# Patient Record
Sex: Male | Born: 1955 | Race: White | Hispanic: No | Marital: Married | Smoking: Never smoker
Health system: Southern US, Community
[De-identification: ages and names within clinical notes are randomized; demographics above are authoritative.]

## PROBLEM LIST (undated history)

## (undated) DIAGNOSIS — E7800 Pure hypercholesterolemia, unspecified: Secondary | ICD-10-CM

## (undated) DIAGNOSIS — Z8639 Personal history of other endocrine, nutritional and metabolic disease: Secondary | ICD-10-CM

## (undated) DIAGNOSIS — E119 Type 2 diabetes mellitus without complications: Secondary | ICD-10-CM

## (undated) DIAGNOSIS — C61 Malignant neoplasm of prostate: Secondary | ICD-10-CM

## (undated) DIAGNOSIS — K219 Gastro-esophageal reflux disease without esophagitis: Secondary | ICD-10-CM

## (undated) DIAGNOSIS — Z87442 Personal history of urinary calculi: Secondary | ICD-10-CM

## (undated) DIAGNOSIS — T8859XA Other complications of anesthesia, initial encounter: Secondary | ICD-10-CM

## (undated) DIAGNOSIS — T4145XA Adverse effect of unspecified anesthetic, initial encounter: Secondary | ICD-10-CM

## (undated) DIAGNOSIS — K5090 Crohn's disease, unspecified, without complications: Secondary | ICD-10-CM

## (undated) HISTORY — DX: Personal history of other endocrine, nutritional and metabolic disease: Z86.39

## (undated) HISTORY — DX: Pure hypercholesterolemia, unspecified: E78.00

## (undated) HISTORY — PX: PROSTATECTOMY: SHX69

## (undated) HISTORY — PX: LITHOTRIPSY: SUR834

## (undated) HISTORY — DX: Gastro-esophageal reflux disease without esophagitis: K21.9

## (undated) HISTORY — DX: Crohn's disease, unspecified, without complications: K50.90

## (undated) HISTORY — DX: Type 2 diabetes mellitus without complications: E11.9

---

## 1997-04-03 HISTORY — PX: TOOTH EXTRACTION: SUR596

## 2011-04-04 HISTORY — PX: COLONOSCOPY: SHX174

## 2017-01-10 DIAGNOSIS — J302 Other seasonal allergic rhinitis: Secondary | ICD-10-CM | POA: Diagnosis not present

## 2017-01-10 DIAGNOSIS — E782 Mixed hyperlipidemia: Secondary | ICD-10-CM | POA: Diagnosis not present

## 2017-01-10 DIAGNOSIS — Z23 Encounter for immunization: Secondary | ICD-10-CM | POA: Diagnosis not present

## 2017-01-10 DIAGNOSIS — N200 Calculus of kidney: Secondary | ICD-10-CM | POA: Diagnosis not present

## 2017-01-10 DIAGNOSIS — K219 Gastro-esophageal reflux disease without esophagitis: Secondary | ICD-10-CM | POA: Diagnosis not present

## 2017-04-24 DIAGNOSIS — R05 Cough: Secondary | ICD-10-CM | POA: Diagnosis not present

## 2017-04-24 DIAGNOSIS — J209 Acute bronchitis, unspecified: Secondary | ICD-10-CM | POA: Diagnosis not present

## 2017-04-24 DIAGNOSIS — K219 Gastro-esophageal reflux disease without esophagitis: Secondary | ICD-10-CM | POA: Diagnosis not present

## 2017-04-24 DIAGNOSIS — J302 Other seasonal allergic rhinitis: Secondary | ICD-10-CM | POA: Diagnosis not present

## 2017-07-05 ENCOUNTER — Encounter: Payer: Self-pay | Admitting: Internal Medicine

## 2017-07-05 DIAGNOSIS — N400 Enlarged prostate without lower urinary tract symptoms: Secondary | ICD-10-CM | POA: Diagnosis not present

## 2017-07-05 DIAGNOSIS — R7303 Prediabetes: Secondary | ICD-10-CM | POA: Diagnosis not present

## 2017-07-05 DIAGNOSIS — K219 Gastro-esophageal reflux disease without esophagitis: Secondary | ICD-10-CM | POA: Diagnosis not present

## 2017-07-05 DIAGNOSIS — E782 Mixed hyperlipidemia: Secondary | ICD-10-CM | POA: Diagnosis not present

## 2017-07-05 DIAGNOSIS — R5382 Chronic fatigue, unspecified: Secondary | ICD-10-CM | POA: Diagnosis not present

## 2017-07-10 DIAGNOSIS — E782 Mixed hyperlipidemia: Secondary | ICD-10-CM | POA: Diagnosis not present

## 2017-07-10 DIAGNOSIS — Z1331 Encounter for screening for depression: Secondary | ICD-10-CM | POA: Diagnosis not present

## 2017-07-10 DIAGNOSIS — N400 Enlarged prostate without lower urinary tract symptoms: Secondary | ICD-10-CM | POA: Diagnosis not present

## 2017-07-10 DIAGNOSIS — Z1389 Encounter for screening for other disorder: Secondary | ICD-10-CM | POA: Diagnosis not present

## 2017-07-10 DIAGNOSIS — R972 Elevated prostate specific antigen [PSA]: Secondary | ICD-10-CM | POA: Diagnosis not present

## 2017-08-06 ENCOUNTER — Encounter: Payer: Self-pay | Admitting: Internal Medicine

## 2017-08-06 DIAGNOSIS — N400 Enlarged prostate without lower urinary tract symptoms: Secondary | ICD-10-CM | POA: Diagnosis not present

## 2017-08-06 DIAGNOSIS — E300 Delayed puberty: Secondary | ICD-10-CM | POA: Diagnosis not present

## 2017-08-06 DIAGNOSIS — K529 Noninfective gastroenteritis and colitis, unspecified: Secondary | ICD-10-CM | POA: Diagnosis not present

## 2017-08-06 DIAGNOSIS — K5090 Crohn's disease, unspecified, without complications: Secondary | ICD-10-CM | POA: Diagnosis not present

## 2017-08-06 DIAGNOSIS — R300 Dysuria: Secondary | ICD-10-CM | POA: Diagnosis not present

## 2017-08-06 DIAGNOSIS — E860 Dehydration: Secondary | ICD-10-CM | POA: Diagnosis not present

## 2017-08-06 DIAGNOSIS — E119 Type 2 diabetes mellitus without complications: Secondary | ICD-10-CM | POA: Diagnosis not present

## 2017-08-06 DIAGNOSIS — K219 Gastro-esophageal reflux disease without esophagitis: Secondary | ICD-10-CM | POA: Diagnosis not present

## 2017-08-06 DIAGNOSIS — R112 Nausea with vomiting, unspecified: Secondary | ICD-10-CM | POA: Diagnosis not present

## 2017-08-06 DIAGNOSIS — R42 Dizziness and giddiness: Secondary | ICD-10-CM | POA: Diagnosis not present

## 2017-08-06 DIAGNOSIS — Z7982 Long term (current) use of aspirin: Secondary | ICD-10-CM | POA: Diagnosis not present

## 2017-08-06 DIAGNOSIS — E872 Acidosis: Secondary | ICD-10-CM | POA: Diagnosis not present

## 2017-08-06 DIAGNOSIS — R110 Nausea: Secondary | ICD-10-CM | POA: Diagnosis not present

## 2017-08-06 DIAGNOSIS — K760 Fatty (change of) liver, not elsewhere classified: Secondary | ICD-10-CM | POA: Diagnosis not present

## 2017-08-06 DIAGNOSIS — R740 Nonspecific elevation of levels of transaminase and lactic acid dehydrogenase [LDH]: Secondary | ICD-10-CM | POA: Diagnosis not present

## 2017-08-06 DIAGNOSIS — K5000 Crohn's disease of small intestine without complications: Secondary | ICD-10-CM | POA: Diagnosis not present

## 2017-08-07 DIAGNOSIS — K5090 Crohn's disease, unspecified, without complications: Secondary | ICD-10-CM | POA: Diagnosis not present

## 2017-08-07 DIAGNOSIS — E300 Delayed puberty: Secondary | ICD-10-CM | POA: Diagnosis not present

## 2017-08-07 DIAGNOSIS — R740 Nonspecific elevation of levels of transaminase and lactic acid dehydrogenase [LDH]: Secondary | ICD-10-CM | POA: Diagnosis not present

## 2017-08-07 DIAGNOSIS — E119 Type 2 diabetes mellitus without complications: Secondary | ICD-10-CM | POA: Diagnosis not present

## 2017-08-07 DIAGNOSIS — K5000 Crohn's disease of small intestine without complications: Secondary | ICD-10-CM | POA: Diagnosis not present

## 2017-08-08 DIAGNOSIS — K5090 Crohn's disease, unspecified, without complications: Secondary | ICD-10-CM | POA: Diagnosis not present

## 2017-08-16 ENCOUNTER — Encounter: Payer: Self-pay | Admitting: Internal Medicine

## 2017-08-16 DIAGNOSIS — A09 Infectious gastroenteritis and colitis, unspecified: Secondary | ICD-10-CM | POA: Diagnosis not present

## 2017-08-16 DIAGNOSIS — K760 Fatty (change of) liver, not elsewhere classified: Secondary | ICD-10-CM | POA: Diagnosis not present

## 2017-08-16 DIAGNOSIS — E119 Type 2 diabetes mellitus without complications: Secondary | ICD-10-CM | POA: Diagnosis not present

## 2017-08-28 ENCOUNTER — Encounter (INDEPENDENT_AMBULATORY_CARE_PROVIDER_SITE_OTHER): Payer: Self-pay | Admitting: Internal Medicine

## 2017-08-28 ENCOUNTER — Ambulatory Visit (INDEPENDENT_AMBULATORY_CARE_PROVIDER_SITE_OTHER): Payer: BLUE CROSS/BLUE SHIELD | Admitting: Internal Medicine

## 2017-08-28 DIAGNOSIS — K50018 Crohn's disease of small intestine with other complication: Secondary | ICD-10-CM

## 2017-08-28 DIAGNOSIS — K5090 Crohn's disease, unspecified, without complications: Secondary | ICD-10-CM

## 2017-08-28 HISTORY — DX: Crohn's disease, unspecified, without complications: K50.90

## 2017-08-28 NOTE — Patient Instructions (Signed)
Lab

## 2017-08-28 NOTE — Progress Notes (Addendum)
   Subjective:    Patient ID: Brett Trevino, male    DOB: 05-07-1955, 62 y.o.   MRN: [id number]  HPI Referred by Dr. Pleas Koch for Crohn's disease. Admitted to [facility] in May with abdominal pain, weakness, nausea x 10 days. Also had vomited which was black. Vomitied x 2 days. He really did not have any diarrhea. There was no fever.  CT abdomen revealed wall thickening with hazy adjacent inflammation involving the distal ileum with the small bowel proximal to this dilated to a maximum of 4.3 cm. The transition point is the proximal extent of the wall thickening of the distal ileum, which is just distal to the terminal ileum. The terminal ileum walll shows fatty replacement, likely result of chronic inflammation. Findings are consistent with Crohn's disease but could reflect an infectious enteritis.  There are short segment areas of apparent wall thickening the ileum, finding that supports Crohn's disease. He was covered with Cipro and steroids while in hospital  Is last colonoscopy was in July of 2013 screening: And was normal.   No family hx of Crohns disease. He says he is 100% better. Appetite is much better. There is occasionally some mild abdominal pain but nothing serious per patient. ' He is having a BM x 2-3 times a day.  No NSAIDs.   5/16/20190 Hand H 14.7 and 44.1  Diabetic x 10  Yrs.    Hx of diabetes. Works as a Office manager Past Medical History:  Diagnosis Date  . Crohn disease ([place]) 08/28/2017  . Diabetes ([place])   . High cholesterol       No Known Allergies  Current Outpatient Medications on File Prior to Visit  Medication Sig Dispense Refill  . aspirin EC 81 MG tablet Take 81 mg by mouth daily.    Marland Kitchen atorvastatin (LIPITOR) 10 MG tablet Take 10 mg by mouth daily.    . cyanocobalamin 1000 MCG tablet Take 1,000 mcg by mouth daily.    . metFORMIN (GLUCOPHAGE) 500 MG tablet Take by mouth 2 (two) times daily with a meal.    . Multiple Vitamins-Minerals  (MULTIVITAMIN ADULTS PO) Take by mouth.     No current facility-administered medications on file prior to visit.         Objective:   Physical Exam Blood pressure 130/82, pulse 64, temperature 98.2 F (36.8 C), height 5\' 7"  (1.702 m), weight 242 lb 1.6 oz (109.8 kg). Alert and oriented. Skin warm and dry. Oral mucosa is moist.   . Sclera anicteric, conjunctivae is pink. Thyroid not enlarged. No cervical lymphadenopathy. Lungs clear. Heart regular rate and rhythm.  Abdomen is soft. Bowel sounds are positive. No hepatomegaly. No abdominal masses felt. No tenderness.  No edema to lower extremities.           Assessment & Plan:  Colitis. Am going to get a IBD panel and sedrate and discuss with DR. Rehman. May need a colonoscopy.

## 2017-08-30 ENCOUNTER — Telehealth (INDEPENDENT_AMBULATORY_CARE_PROVIDER_SITE_OTHER): Payer: Self-pay | Admitting: Internal Medicine

## 2017-08-30 NOTE — Telephone Encounter (Signed)
Results given to patient

## 2017-08-30 NOTE — Telephone Encounter (Signed)
Please call patient at ph# [phone]

## 2017-08-31 ENCOUNTER — Ambulatory Visit (INDEPENDENT_AMBULATORY_CARE_PROVIDER_SITE_OTHER): Payer: BLUE CROSS/BLUE SHIELD | Admitting: Urology

## 2017-08-31 DIAGNOSIS — N401 Enlarged prostate with lower urinary tract symptoms: Secondary | ICD-10-CM | POA: Diagnosis not present

## 2017-08-31 DIAGNOSIS — N5201 Erectile dysfunction due to arterial insufficiency: Secondary | ICD-10-CM

## 2017-08-31 DIAGNOSIS — R972 Elevated prostate specific antigen [PSA]: Secondary | ICD-10-CM

## 2017-08-31 DIAGNOSIS — R351 Nocturia: Secondary | ICD-10-CM | POA: Diagnosis not present

## 2017-09-03 LAB — INFLAM. BOWEL DISEASE DIFF. PANEL
ANCA SCREEN: NEGATIVE
Myeloperoxidase Abs: <1 AI (ref <1.0)
SACCHAROMYCES CEREVISIAE AB (ASCA)(IGA): 11.2 U (ref <=20.0)
SACCHAROMYCES CEREVISIAE AB (ASCA)(IGG): 36.6 U — AB (ref <=20.0)
Serine Protease 3: <1 AI (ref <1.0)

## 2017-09-03 LAB — SEDIMENTATION RATE: SED RATE: 9 mm/h (ref 0–20)

## 2017-09-04 ENCOUNTER — Telehealth (INDEPENDENT_AMBULATORY_CARE_PROVIDER_SITE_OTHER): Payer: Self-pay | Admitting: Internal Medicine

## 2017-09-04 DIAGNOSIS — R935 Abnormal findings on diagnostic imaging of other abdominal regions, including retroperitoneum: Secondary | ICD-10-CM

## 2017-09-04 DIAGNOSIS — R1030 Lower abdominal pain, unspecified: Secondary | ICD-10-CM

## 2017-09-04 NOTE — Telephone Encounter (Signed)
Will order a CT abdomen/pelvis with CM

## 2017-09-04 NOTE — Telephone Encounter (Signed)
Brett Trevino, CT abdomen/pelvis w/wo

## 2017-09-05 ENCOUNTER — Telehealth (INDEPENDENT_AMBULATORY_CARE_PROVIDER_SITE_OTHER): Payer: Self-pay | Admitting: Internal Medicine

## 2017-09-05 DIAGNOSIS — R1031 Right lower quadrant pain: Secondary | ICD-10-CM

## 2017-09-05 DIAGNOSIS — R935 Abnormal findings on diagnostic imaging of other abdominal regions, including retroperitoneum: Secondary | ICD-10-CM

## 2017-09-05 NOTE — Telephone Encounter (Signed)
CT sch'd 09/20/17 at 4 pm (345), water only after 10 pm, pick up contrast, left detailed message for patient

## 2017-09-05 NOTE — Telephone Encounter (Signed)
CT ordered. 

## 2017-09-05 NOTE — Addendum Note (Signed)
Addended by: Butch Penny on: 09/05/2017 11:46 AM   Modules accepted: Orders

## 2017-09-07 DIAGNOSIS — I700 Atherosclerosis of aorta: Secondary | ICD-10-CM | POA: Diagnosis not present

## 2017-09-07 DIAGNOSIS — E119 Type 2 diabetes mellitus without complications: Secondary | ICD-10-CM | POA: Diagnosis not present

## 2017-09-07 DIAGNOSIS — K760 Fatty (change of) liver, not elsewhere classified: Secondary | ICD-10-CM | POA: Diagnosis not present

## 2017-09-07 DIAGNOSIS — A09 Infectious gastroenteritis and colitis, unspecified: Secondary | ICD-10-CM | POA: Diagnosis not present

## 2017-09-14 ENCOUNTER — Encounter (HOSPITAL_COMMUNITY): Payer: Self-pay

## 2017-09-14 ENCOUNTER — Other Ambulatory Visit: Payer: Self-pay | Admitting: Urology

## 2017-09-14 ENCOUNTER — Ambulatory Visit (HOSPITAL_COMMUNITY)
Admission: RE | Admit: 2017-09-14 | Discharge: 2017-09-14 | Disposition: A | Discharge status: Home / Self Care | Payer: BLUE CROSS/BLUE SHIELD | Source: Ambulatory Visit | Attending: Urology | Admitting: Urology

## 2017-09-14 DIAGNOSIS — R972 Elevated prostate specific antigen [PSA]: Secondary | ICD-10-CM | POA: Insufficient documentation

## 2017-09-14 MED ORDER — LIDOCAINE HCL (PF) 2 % IJ SOLN
INTRAMUSCULAR | Status: AC
  Filled 2017-09-14: qty 10

## 2017-09-14 MED ORDER — CEFTRIAXONE SODIUM 1 G IJ SOLR: 1.0000 g | Freq: Once | INTRAMUSCULAR | Status: DC

## 2017-09-14 MED ORDER — LIDOCAINE HCL (PF) 2 % IJ SOLN
10.0000 mL | Freq: Once | INTRAMUSCULAR | Status: DC
Start: 2017-09-14 — End: 2017-09-15

## 2017-09-14 MED ORDER — LIDOCAINE HCL (PF) 1 % IJ SOLN
INTRAMUSCULAR | Status: AC
  Filled 2017-09-14: qty 5

## 2017-09-14 MED ORDER — CEFTRIAXONE SODIUM 1 G IJ SOLR
INTRAMUSCULAR | Status: AC
  Filled 2017-09-14: qty 10

## 2017-09-18 ENCOUNTER — Other Ambulatory Visit: Payer: Self-pay | Admitting: Urology

## 2017-09-20 ENCOUNTER — Ambulatory Visit (HOSPITAL_COMMUNITY)
Admission: RE | Admit: 2017-09-20 | Discharge: 2017-09-20 | Disposition: A | Discharge status: Home / Self Care | Payer: BLUE CROSS/BLUE SHIELD | Source: Ambulatory Visit | Attending: Internal Medicine | Admitting: Internal Medicine

## 2017-09-20 DIAGNOSIS — R1031 Right lower quadrant pain: Secondary | ICD-10-CM | POA: Diagnosis not present

## 2017-09-20 DIAGNOSIS — K760 Fatty (change of) liver, not elsewhere classified: Secondary | ICD-10-CM | POA: Insufficient documentation

## 2017-09-20 DIAGNOSIS — N200 Calculus of kidney: Secondary | ICD-10-CM | POA: Insufficient documentation

## 2017-09-20 DIAGNOSIS — R109 Unspecified abdominal pain: Secondary | ICD-10-CM | POA: Diagnosis not present

## 2017-09-20 DIAGNOSIS — I700 Atherosclerosis of aorta: Secondary | ICD-10-CM | POA: Insufficient documentation

## 2017-09-20 DIAGNOSIS — R1110 Vomiting, unspecified: Secondary | ICD-10-CM | POA: Diagnosis not present

## 2017-09-20 LAB — POCT I-STAT CREATININE: CREATININE: 0.9 mg/dL (ref 0.61–1.24)

## 2017-09-20 MED ORDER — IOPAMIDOL (ISOVUE-300) INJECTION 61%
100.0000 mL | Freq: Once | INTRAVENOUS | Status: AC | PRN
  Administered 2017-09-20: 100 mL via INTRAVENOUS

## 2017-09-21 NOTE — Patient Instructions (Signed)
Brett Trevino  09/21/2017     @PREFPERIOPPHARMACY @   Your procedure is scheduled on  09/28/2017   Report to [facility] at  1000   A.M.  Call this number if you have problems the morning of surgery:  [phone]   Remember:  Do not eat or drink after midnight.  You may drink clear liquids until 12 midnight on 09/27/2017 .  Clear liquids allowed are:                    Water, Juice (non-citric and without pulp), Carbonated beverages, Clear Tea, Black Coffee only, Plain Jell-O only, Gatorade and Plain Popsicles only    Take these medicines the morning of surgery with A SIP OF WATER  None    Do not wear jewelry, make-up or nail polish.  Do not wear lotions, powders, or perfumes, or deodorant.  Do not shave 48 hours prior to surgery.  Men may shave face and neck.  Do not bring valuables to the hospital.  [facility] is not responsible for any belongings or valuables.  Contacts, dentures or bridgework may not be worn into surgery.  Leave your suitcase in the car.  After surgery it may be brought to your room.  For patients admitted to the hospital, discharge time will be determined by your treatment team.  Patients discharged the day of surgery will not be allowed to drive home.   Name and phone number of your driver:   family Special instructions:    Please read over the following fact sheets that you were given. Anesthesia Post-op Instructions and Care and Recovery After Surgery       Transrectal Ultrasound-Guided Biopsy A transrectal ultrasound-guided biopsy is a procedure to take samples of tissue from your prostate. Ultrasound images are used to guide the procedure. It is usually done to check the prostate gland for cancer. What happens before the procedure?  Do not eat or drink after midnight on the night before your procedure.  Take medicines as your doctor tells you.  Your doctor may have you stop taking some medicines 5-7 days before the  procedure.  You will be given an enema before your procedure. During an enema, a liquid is put into your butt (rectum) to clear out waste.  You may have lab tests the day of your procedure.  Make plans to have someone drive you home. What happens during the procedure?  You will be given medicine to help you relax before the procedure. An IV tube will be put into one of your veins. It will be used to give fluids and medicine.  You will be given medicine to reduce the risk of infection (antibiotic).  You will be placed on your side.  A probe with gel will be put in your butt. This is used to take pictures of your prostate and the area around it.  A medicine to numb the area is put into your prostate.  A biopsy needle is then inserted and guided to your prostate.  Samples of prostate tissue are taken. The needle is removed.  The samples are sent to a lab to be checked. Results are usually back in 2-3 days. What happens after the procedure?  You will be taken to a room where you will be watched until you are doing okay.  You may have some pain in the area around your butt. You will be given medicines for this.  You  may be able to go home the same day. Sometimes, an overnight stay in the hospital is needed. This information is not intended to replace advice given to you by your health care provider. Make sure you discuss any questions you have with your health care provider. Document Released: 03/08/2009 Document Revised: 08/26/2015 Document Reviewed: 11/06/2012 Elsevier Interactive Patient Education  2018 [employer].  Transrectal Ultrasound Transrectal ultrasound is a procedure that uses sound waves to create images of your prostate gland and nearby tissues. The sound waves are sent through the wall of your rectum into your prostate gland, which is located in front of your rectum. The images show the size and shape of your prostate gland and nearby structures. You may have this  test if you have:  Trouble urinating.  Infertility.  An abnormal prostate screening exam.  Tell a health care provider about:  Any allergies you have.  All medicines you are taking, including vitamins, herbs, eye drops, creams, and over-the-counter medicines.  Any blood disorders you have.  Any medical conditions you have.  Any surgeries you have had. What are the risks? Generally, this is a safe procedure. However, problems may occur, including:  Discomfort during the procedure. This is rare.  Blood in your urine or sperm after the procedure. This is rare.  What happens before the procedure?  Your health care provider may instruct you to use an enema 1-4 hours before the procedure. Follow instructions from your health care provider about how to do the enema.  Ask your health care provider about changing or stopping your regular medicines. This is especially important if you are taking diabetes medicines or blood thinners. What happens during the procedure?  You will be asked to lie down on your left side on an examination table.  You will bend your knees toward your chest.  A lubricated probe will be gently inserted into your rectum. This may cause a feeling of fullness.  The probe will send signals to a computer that will create images.  The technician will slightly rotate the probe throughout the procedure. While rotating the probe, he or she will view and capture images of the prostate gland and the surrounding structures from different angles.  The probe will be removed. The procedure may vary among health care providers and hospitals. What happens after the procedure?  It is up to you to get the results of your procedure. Ask your health care provider, or the department that is doing the procedure, when your results will be ready. Summary  Transrectal ultrasound is a procedure that uses sound waves to create images of your prostate gland and nearby  tissues.  The images show the size and shape of your prostate gland and nearby structures.  Before the procedure, ask your health care provider about changing or stopping your regular medicines. This is especially important if you are taking diabetes medicines or blood thinners. This information is not intended to replace advice given to you by your health care provider. Make sure you discuss any questions you have with your health care provider. Document Released: 12/28/2004 Document Revised: 02/11/2016 Document Reviewed: 02/11/2016 Elsevier Interactive Patient Education  2017 [place] Anesthesia is a term that refers to techniques, procedures, and medicines that help a person stay safe and comfortable during a medical procedure. Monitored anesthesia care, or sedation, is one type of anesthesia. Your anesthesia specialist may recommend sedation if you will be having a procedure that does not require  you to be unconscious, such as:  Cataract surgery.  A dental procedure.  A biopsy.  A colonoscopy.  During the procedure, you may receive a medicine to help you relax (sedative). There are three levels of sedation:  Mild sedation. At this level, you may feel awake and relaxed. You will be able to follow directions.  Moderate sedation. At this level, you will be sleepy. You may not remember the procedure.  Deep sedation. At this level, you will be asleep. You will not remember the procedure.  The more medicine you are given, the deeper your level of sedation will be. Depending on how you respond to the procedure, the anesthesia specialist may change your level of sedation or the type of anesthesia to fit your needs. An anesthesia specialist will monitor you closely during the procedure. Let your health care provider know about:  Any allergies you have.  All medicines you are taking, including vitamins, herbs, eye drops, creams, and over-the-counter  medicines.  Any use of steroids (by mouth or as a cream).  Any problems you or family members have had with sedatives and anesthetic medicines.  Any blood disorders you have.  Any surgeries you have had.  Any medical conditions you have, such as sleep apnea.  Whether you are pregnant or may be pregnant.  Any use of cigarettes, alcohol, or street drugs. What are the risks? Generally, this is a safe procedure. However, problems may occur, including:  Getting too much medicine (oversedation).  Nausea.  Allergic reaction to medicines.  Trouble breathing. If this happens, a breathing tube may be used to help with breathing. It will be removed when you are awake and breathing on your own.  Heart trouble.  Lung trouble.  Before the procedure Staying hydrated Follow instructions from your health care provider about hydration, which may include:  Up to 2 hours before the procedure - you may continue to drink clear liquids, such as water, clear fruit juice, black coffee, and plain tea.  Eating and drinking restrictions Follow instructions from your health care provider about eating and drinking, which may include:  8 hours before the procedure - stop eating heavy meals or foods such as meat, fried foods, or fatty foods.  6 hours before the procedure - stop eating light meals or foods, such as toast or cereal.  6 hours before the procedure - stop drinking milk or drinks that contain milk.  2 hours before the procedure - stop drinking clear liquids.  Medicines Ask your health care provider about:  Changing or stopping your regular medicines. This is especially important if you are taking diabetes medicines or blood thinners.  Taking medicines such as aspirin and ibuprofen. These medicines can thin your blood. Do not take these medicines before your procedure if your health care provider instructs you not to.  Tests and exams  You will have a physical exam.  You may have  blood tests done to show: ? How well your kidneys and liver are working. ? How well your blood can clot.  General instructions  Plan to have someone take you home from the hospital or clinic.  If you will be going home right after the procedure, plan to have someone with you for 24 hours.  What happens during the procedure?  Your blood pressure, heart rate, breathing, level of pain and overall condition will be monitored.  An IV tube will be inserted into one of your veins.  Your anesthesia specialist will give you medicines  as needed to keep you comfortable during the procedure. This may mean changing the level of sedation.  The procedure will be performed. After the procedure  Your blood pressure, heart rate, breathing rate, and blood oxygen level will be monitored until the medicines you were given have worn off.  Do not drive for 24 hours if you received a sedative.  You may: ? Feel sleepy, clumsy, or nauseous. ? Feel forgetful about what happened after the procedure. ? Have a sore throat if you had a breathing tube during the procedure. ? Vomit. This information is not intended to replace advice given to you by your health care provider. Make sure you discuss any questions you have with your health care provider. Document Released: 12/14/2004 Document Revised: 08/27/2015 Document Reviewed: 07/11/2015 Elsevier Interactive Patient Education  2018 [place], Care After These instructions provide you with information about caring for yourself after your procedure. Your health care provider may also give you more specific instructions. Your treatment has been planned according to current medical practices, but problems sometimes occur. Call your health care provider if you have any problems or questions after your procedure. What can I expect after the procedure? After your procedure, it is common to:  Feel sleepy for several hours.  Feel clumsy  and have poor balance for several hours.  Feel forgetful about what happened after the procedure.  Have poor judgment for several hours.  Feel nauseous or vomit.  Have a sore throat if you had a breathing tube during the procedure.  Follow these instructions at home: For at least 24 hours after the procedure:   Do not: ? Participate in activities in which you could fall or become injured. ? Drive. ? Use heavy machinery. ? Drink alcohol. ? Take sleeping pills or medicines that cause drowsiness. ? Make important decisions or sign legal documents. ? Take care of children on your own.  Rest. Eating and drinking  Follow the diet that is recommended by your health care provider.  If you vomit, drink water, juice, or soup when you can drink without vomiting.  Make sure you have little or no nausea before eating solid foods. General instructions  Have a responsible adult stay with you until you are awake and alert.  Take over-the-counter and prescription medicines only as told by your health care provider.  If you smoke, do not smoke without supervision.  Keep all follow-up visits as told by your health care provider. This is important. Contact a health care provider if:  You keep feeling nauseous or you keep vomiting.  You feel light-headed.  You develop a rash.  You have a fever. Get help right away if:  You have trouble breathing. This information is not intended to replace advice given to you by your health care provider. Make sure you discuss any questions you have with your health care provider. Document Released: 07/11/2015 Document Revised: 11/10/2015 Document Reviewed: 07/11/2015 Elsevier Interactive Patient Education  [employer].

## 2017-09-25 ENCOUNTER — Other Ambulatory Visit: Payer: Self-pay

## 2017-09-25 ENCOUNTER — Encounter (HOSPITAL_COMMUNITY): Payer: Self-pay

## 2017-09-25 ENCOUNTER — Encounter (HOSPITAL_COMMUNITY)
Admission: RE | Admit: 2017-09-25 | Discharge: 2017-09-25 | Disposition: A | Discharge status: Home / Self Care | Payer: BLUE CROSS/BLUE SHIELD | Source: Ambulatory Visit | Attending: Urology | Admitting: Urology

## 2017-09-25 DIAGNOSIS — Z01810 Encounter for preprocedural cardiovascular examination: Secondary | ICD-10-CM | POA: Insufficient documentation

## 2017-09-25 DIAGNOSIS — Z01812 Encounter for preprocedural laboratory examination: Secondary | ICD-10-CM | POA: Insufficient documentation

## 2017-09-25 HISTORY — DX: Other complications of anesthesia, initial encounter: T88.59XA

## 2017-09-25 HISTORY — DX: Adverse effect of unspecified anesthetic, initial encounter: T41.45XA

## 2017-09-25 LAB — CBC WITH DIFFERENTIAL/PLATELET
BASOS ABS: 0 10*3/uL (ref 0.0–0.1)
Basophils Relative: 1 %
Eosinophils Absolute: 0.2 10*3/uL (ref 0.0–0.7)
Eosinophils Relative: 3 %
HEMATOCRIT: 42.6 % (ref 39.0–52.0)
HEMOGLOBIN: 14.6 g/dL (ref 13.0–17.0)
Lymphocytes Relative: 31 %
Lymphs Abs: 2 10*3/uL (ref 0.7–4.0)
MCH: 31.7 pg (ref 26.0–34.0)
MCHC: 34.3 g/dL (ref 30.0–36.0)
MCV: 92.6 fL (ref 78.0–100.0)
Monocytes Absolute: 0.6 10*3/uL (ref 0.1–1.0)
Monocytes Relative: 9 %
NEUTROS ABS: 3.8 10*3/uL (ref 1.7–7.7)
Neutrophils Relative %: 56 %
Platelets: 234 10*3/uL (ref 150–400)
RBC: 4.6 MIL/uL (ref 4.22–5.81)
RDW: 14.1 % (ref 11.5–15.5)
WBC: 6.6 10*3/uL (ref 4.0–10.5)

## 2017-09-25 LAB — BASIC METABOLIC PANEL
ANION GAP: 8 (ref 5–15)
BUN: 9 mg/dL (ref 8–23)
CHLORIDE: 110 mmol/L (ref 98–111)
CO2: 25 mmol/L (ref 22–32)
Calcium: 8.9 mg/dL (ref 8.9–10.3)
Creatinine, Ser: 0.89 mg/dL (ref 0.61–1.24)
GFR calc Af Amer: >60 mL/min (ref >60)
GLUCOSE: 121 mg/dL — AB (ref 70–99)
POTASSIUM: 3.6 mmol/L (ref 3.5–5.1)
Sodium: 143 mmol/L (ref 135–145)

## 2017-09-25 LAB — GLUCOSE, CAPILLARY: Glucose-Capillary: 134 mg/dL — ABNORMAL HIGH (ref 70–99)

## 2017-09-25 NOTE — Progress Notes (Signed)
   09/25/17 1442  OBSTRUCTIVE SLEEP APNEA  Have you ever been diagnosed with sleep apnea through a sleep study? No  Do you snore loudly (loud enough to be heard through closed doors)?  1  Do you often feel tired, fatigued, or sleepy during the daytime (such as falling asleep during driving or talking to someone)? 0  Has anyone observed you stop breathing during your sleep? 0  Do you have, or are you being treated for high blood pressure? 0  BMI more than 35 kg/m2? 1  Age > 50 (1-yes) 1  Neck circumference greater than:Male 16 inches or larger, Male 17inches or larger? 0  Male Gender (Yes=1) 1  Obstructive Sleep Apnea Score 4

## 2017-09-26 LAB — HEMOGLOBIN A1C
Hgb A1c MFr Bld: 6.3 % — ABNORMAL HIGH (ref 4.8–5.6)
Mean Plasma Glucose: 134.11 mg/dL

## 2017-09-28 ENCOUNTER — Encounter (HOSPITAL_COMMUNITY): Payer: Self-pay | Admitting: *Deleted

## 2017-09-28 ENCOUNTER — Ambulatory Visit (HOSPITAL_COMMUNITY): Payer: BLUE CROSS/BLUE SHIELD | Admitting: Anesthesiology

## 2017-09-28 ENCOUNTER — Other Ambulatory Visit: Payer: Self-pay

## 2017-09-28 ENCOUNTER — Ambulatory Visit (HOSPITAL_COMMUNITY)
Admission: RE | Admit: 2017-09-28 | Discharge: 2017-09-28 | Disposition: A | Discharge status: Home / Self Care | Payer: BLUE CROSS/BLUE SHIELD | Source: Ambulatory Visit | Attending: Urology | Admitting: Urology

## 2017-09-28 ENCOUNTER — Encounter (HOSPITAL_COMMUNITY)
Admission: RE | Disposition: A | Discharge status: Home / Self Care | Payer: Self-pay | Source: Ambulatory Visit | Attending: Urology

## 2017-09-28 ENCOUNTER — Ambulatory Visit (HOSPITAL_COMMUNITY): Payer: BLUE CROSS/BLUE SHIELD

## 2017-09-28 DIAGNOSIS — K624 Stenosis of anus and rectum: Secondary | ICD-10-CM | POA: Diagnosis not present

## 2017-09-28 DIAGNOSIS — N5201 Erectile dysfunction due to arterial insufficiency: Secondary | ICD-10-CM | POA: Insufficient documentation

## 2017-09-28 DIAGNOSIS — R351 Nocturia: Secondary | ICD-10-CM | POA: Diagnosis not present

## 2017-09-28 DIAGNOSIS — Z79899 Other long term (current) drug therapy: Secondary | ICD-10-CM | POA: Insufficient documentation

## 2017-09-28 DIAGNOSIS — D075 Carcinoma in situ of prostate: Secondary | ICD-10-CM | POA: Insufficient documentation

## 2017-09-28 DIAGNOSIS — Z7984 Long term (current) use of oral hypoglycemic drugs: Secondary | ICD-10-CM | POA: Insufficient documentation

## 2017-09-28 DIAGNOSIS — K219 Gastro-esophageal reflux disease without esophagitis: Secondary | ICD-10-CM | POA: Diagnosis not present

## 2017-09-28 DIAGNOSIS — N401 Enlarged prostate with lower urinary tract symptoms: Secondary | ICD-10-CM | POA: Insufficient documentation

## 2017-09-28 DIAGNOSIS — R972 Elevated prostate specific antigen [PSA]: Secondary | ICD-10-CM

## 2017-09-28 DIAGNOSIS — Z7982 Long term (current) use of aspirin: Secondary | ICD-10-CM | POA: Insufficient documentation

## 2017-09-28 DIAGNOSIS — E119 Type 2 diabetes mellitus without complications: Secondary | ICD-10-CM | POA: Diagnosis not present

## 2017-09-28 DIAGNOSIS — C61 Malignant neoplasm of prostate: Secondary | ICD-10-CM | POA: Insufficient documentation

## 2017-09-28 DIAGNOSIS — E7800 Pure hypercholesterolemia, unspecified: Secondary | ICD-10-CM | POA: Insufficient documentation

## 2017-09-28 HISTORY — PX: PROSTATE BIOPSY: SHX241

## 2017-09-28 LAB — GLUCOSE, CAPILLARY
GLUCOSE-CAPILLARY: 115 mg/dL — AB (ref 70–99)
Glucose-Capillary: 125 mg/dL — ABNORMAL HIGH (ref 70–99)

## 2017-09-28 SURGERY — BIOPSY, PROSTATE, RECTAL APPROACH, WITH US GUIDANCE
Anesthesia: Monitor Anesthesia Care | Site: Rectum

## 2017-09-28 MED ORDER — LIDOCAINE HCL (PF) 2 % IJ SOLN
INTRAMUSCULAR | Status: AC
Start: 2017-09-28 — End: ?
  Filled 2017-09-28: qty 10

## 2017-09-28 MED ORDER — SODIUM CHLORIDE 0.9% FLUSH: 3.0000 mL | Freq: Two times a day (BID) | INTRAVENOUS | Status: DC

## 2017-09-28 MED ORDER — SODIUM CHLORIDE 0.9 % IV SOLN
2.0000 g | INTRAVENOUS | Status: AC
  Administered 2017-09-28: 2 g via INTRAVENOUS
  Filled 2017-09-28 (×2): qty 20

## 2017-09-28 MED ORDER — GLYCOPYRROLATE 0.2 MG/ML IJ SOLN
INTRAMUSCULAR | Status: AC
  Filled 2017-09-28: qty 1

## 2017-09-28 MED ORDER — PROPOFOL 10 MG/ML IV BOLUS
INTRAVENOUS | Status: AC
Start: 2017-09-28 — End: ?
  Filled 2017-09-28: qty 20

## 2017-09-28 MED ORDER — FENTANYL CITRATE (PF) 100 MCG/2ML IJ SOLN
INTRAMUSCULAR | Status: DC | PRN
  Administered 2017-09-28 (×2): 50 ug via INTRAVENOUS

## 2017-09-28 MED ORDER — ONDANSETRON HCL 4 MG/2ML IJ SOLN
INTRAMUSCULAR | Status: DC | PRN
  Administered 2017-09-28: 4 mg via INTRAVENOUS

## 2017-09-28 MED ORDER — OXYCODONE HCL 5 MG PO TABS: 5.0000 mg | ORAL_TABLET | ORAL | Status: DC | PRN

## 2017-09-28 MED ORDER — ACETAMINOPHEN 325 MG PO TABS: 650.0000 mg | ORAL_TABLET | ORAL | Status: DC | PRN

## 2017-09-28 MED ORDER — MIDAZOLAM HCL 5 MG/5ML IJ SOLN
INTRAMUSCULAR | Status: DC | PRN
  Administered 2017-09-28: 2 mg via INTRAVENOUS

## 2017-09-28 MED ORDER — ACETAMINOPHEN 650 MG RE SUPP
650.0000 mg | RECTAL | Status: DC | PRN
  Filled 2017-09-28: qty 1

## 2017-09-28 MED ORDER — HYDROMORPHONE HCL 1 MG/ML IJ SOLN: 0.2500 mg | INTRAMUSCULAR | Status: DC | PRN

## 2017-09-28 MED ORDER — MIDAZOLAM HCL 2 MG/2ML IJ SOLN
INTRAMUSCULAR | Status: AC
  Filled 2017-09-28: qty 2

## 2017-09-28 MED ORDER — SODIUM CHLORIDE 0.9 % IV SOLN: 250.0000 mL | INTRAVENOUS | Status: DC | PRN

## 2017-09-28 MED ORDER — MORPHINE SULFATE (PF) 2 MG/ML IV SOLN: 1.0000 mg | INTRAVENOUS | Status: DC | PRN

## 2017-09-28 MED ORDER — HYDROCODONE-ACETAMINOPHEN 7.5-325 MG PO TABS: 1.0000 | ORAL_TABLET | Freq: Once | ORAL | Status: DC | PRN

## 2017-09-28 MED ORDER — FENTANYL CITRATE (PF) 100 MCG/2ML IJ SOLN
INTRAMUSCULAR | Status: AC
  Filled 2017-09-28: qty 2

## 2017-09-28 MED ORDER — LIDOCAINE HCL (PF) 1 % IJ SOLN
INTRAMUSCULAR | Status: AC
  Filled 2017-09-28: qty 5

## 2017-09-28 MED ORDER — PROPOFOL 10 MG/ML IV BOLUS
INTRAVENOUS | Status: AC
  Filled 2017-09-28: qty 20

## 2017-09-28 MED ORDER — ONDANSETRON HCL 4 MG/2ML IJ SOLN: 4.0000 mg | Freq: Once | INTRAMUSCULAR | Status: DC | PRN

## 2017-09-28 MED ORDER — ONDANSETRON HCL 4 MG/2ML IJ SOLN
INTRAMUSCULAR | Status: AC
Start: 2017-09-28 — End: ?
  Filled 2017-09-28: qty 2

## 2017-09-28 MED ORDER — PROPOFOL 10 MG/ML IV BOLUS
INTRAVENOUS | Status: DC | PRN
  Administered 2017-09-28 (×3): 20 mg via INTRAVENOUS
  Administered 2017-09-28: 30 mg via INTRAVENOUS
  Administered 2017-09-28 (×2): 20 mg via INTRAVENOUS

## 2017-09-28 MED ORDER — KETOROLAC TROMETHAMINE 30 MG/ML IJ SOLN: 30.0000 mg | Freq: Once | INTRAMUSCULAR | Status: DC | PRN

## 2017-09-28 MED ORDER — LACTATED RINGERS IV SOLN
INTRAVENOUS | Status: DC
  Administered 2017-09-28: 11:00:00 via INTRAVENOUS

## 2017-09-28 MED ORDER — GENTAMICIN SULFATE 40 MG/ML IJ SOLN
5.0000 mg/kg | INTRAVENOUS | Status: AC
  Administered 2017-09-28: 420 mg via INTRAVENOUS
  Filled 2017-09-28: qty 10.5

## 2017-09-28 MED ORDER — SODIUM CHLORIDE 0.9% FLUSH: 3.0000 mL | INTRAVENOUS | Status: DC | PRN

## 2017-09-28 MED ORDER — LIDOCAINE 2% (20 MG/ML) 5 ML SYRINGE
INTRAMUSCULAR | Status: DC | PRN
  Administered 2017-09-28: 60 mg via INTRAVENOUS

## 2017-09-28 MED ORDER — MEPERIDINE HCL 50 MG/ML IJ SOLN: 6.2500 mg | INTRAMUSCULAR | Status: DC | PRN

## 2017-09-28 MED ORDER — GLYCOPYRROLATE 0.2 MG/ML IJ SOLN
0.2000 mg | Freq: Once | INTRAMUSCULAR | Status: AC
  Administered 2017-09-28: 0.2 mg via INTRAVENOUS

## 2017-09-28 SURGICAL SUPPLY — 13 items
CLOTH BEACON ORANGE TIMEOUT ST (SAFETY) ×2 IMPLANT
COVER MAYO STAND XLG (DRAPE) ×2 IMPLANT
FORMALIN 10 PREFIL 20ML (MISCELLANEOUS) ×20 IMPLANT
GLOVE SURG SS PI 8.0 STRL IVOR (GLOVE) IMPLANT
KIT TURNOVER CYSTO (KITS) ×2 IMPLANT
LUBRICANT JELLY 4.5OZ STERILE (MISCELLANEOUS) ×2 IMPLANT
NDL SAFETY ECLIPSE 18X1.5 (NEEDLE) ×1 IMPLANT
NEEDLE BIOPSY 18X20 MAGNUM (NEEDLE) ×2 IMPLANT
NEEDLE HYPO 18GX1.5 SHARP (NEEDLE) ×1
NEEDLE SPNL 22GX8 TOUHY BVL BK (NEEDLE) ×2 IMPLANT
PAD ARMBOARD 7.5X6 YLW CONV (MISCELLANEOUS) ×2 IMPLANT
SYR 10ML LL (SYRINGE) ×2 IMPLANT
TOWEL OR 17X26 4PK STRL BLUE (TOWEL DISPOSABLE) ×2 IMPLANT

## 2017-09-28 NOTE — Transfer of Care (Signed)
Immediate Anesthesia Transfer of Care Note  Patient: Markelle Najarian  Procedure(s) Performed: BIOPSY TRANSRECTAL ULTRASONIC PROSTATE (TUBP) (N/A Rectum)  Patient Location: PACU  Anesthesia Type:MAC  Level of Consciousness: sedated  Airway & Oxygen Therapy: Patient Spontanous Breathing  Post-op Assessment: Report given to RN and Post -op Vital signs reviewed and stable  Post vital signs: Reviewed and stable  Last Vitals:  Vitals Value Taken Time  BP    Temp    Pulse 64 09/28/2017 12:35 PM  Resp 11 09/28/2017 12:35 PM  SpO2 93 % 09/28/2017 12:35 PM  Vitals shown include unvalidated device data.  Last Pain:  Vitals:   09/28/17 1024  TempSrc: Oral  PainSc: 0-No pain         Complications: No apparent anesthesia complications

## 2017-09-28 NOTE — Op Note (Signed)
Procedure: 1.  Transrectal prostate ultrasound. 2.  Ultrasound-guided prostate biopsy.  Preop diagnosis: Elevated PSA.  Postop diagnosis: Same.  Surgeon: Dr. Irine Seal.  Anesthesia: MAC.  Specimen: 12 core prostate biopsy.  Drain: None.  EBL: None.  Complications: None.  Indications: Brett Trevino is a 62 year old white male who is to undergo a transrectal ultrasound of the prostate with biopsy for PSA of 17.  A prior attempted biopsy was unsuccessful due to patient's anal stenosis so it was rescheduled under anesthesia.  Procedure: He was taken to the operating room where he was given Rocephin and gentamicin.  He was placed in the left lateral decubitus position and sedation was given as needed.  The transrectal ultrasound probe was assembled and inserted with some resistance from the known anal stenosis and the prostate was examined.  The prostate ultrasound demonstrated a 21 mL prostate with a normal peripheral zone and no hypoechoic lesion in the peripheral zone.  There was a hypoechoic area in the left apical transitional zone but this could be just a BPH nodule.  There were a few scattered calculi.  The seminal vesicles were normal.  After completion of the diagnostic scan a 12 core template biopsy was performed in the usual configuration.  There was minimal bleeding.  After completion of the biopsy the probe was removed and the patient was rolled supine.  He was then moved to the recovery room in stable condition.  There were no complications.

## 2017-09-28 NOTE — Anesthesia Preprocedure Evaluation (Signed)
Anesthesia Evaluation  Patient identified by MRN, date of birth, ID band Patient awake    Reviewed: Allergy & Precautions, H&P , NPO status , Patient's Chart, lab work & pertinent test results, reviewed documented beta blocker date and time   Airway Mallampati: II  TM Distance: >3 FB Neck ROM: full    Dental no notable dental hx.    Pulmonary neg pulmonary ROS,    Pulmonary exam normal breath sounds clear to auscultation       Cardiovascular Exercise Tolerance: Good negative cardio ROS   Rhythm:regular Rate:Normal     Neuro/Psych negative neurological ROS  negative psych ROS   GI/Hepatic negative GI ROS, Neg liver ROS,   Endo/Other  negative endocrine ROSdiabetes, Type 2  Renal/GU negative Renal ROS  negative genitourinary   Musculoskeletal   Abdominal   Peds  Hematology negative hematology ROS (+)   Anesthesia Other Findings   Reproductive/Obstetrics negative OB ROS                             Anesthesia Physical Anesthesia Plan  ASA: II  Anesthesia Plan: MAC   Post-op Pain Management:    Induction:   PONV Risk Score and Plan:   Airway Management Planned:   Additional Equipment:   Intra-op Plan:   Post-operative Plan:   Informed Consent: I have reviewed the patients History and Physical, chart, labs and discussed the procedure including the risks, benefits and alternatives for the proposed anesthesia with the patient or authorized representative who has indicated his/her understanding and acceptance.   Dental Advisory Given  Plan Discussed with: CRNA  Anesthesia Plan Comments:         Anesthesia Quick Evaluation

## 2017-09-28 NOTE — Progress Notes (Signed)
Patient "feeling much better". Skin dry to touch, patient sitting in recliner talking with wife.

## 2017-09-28 NOTE — H&P (Signed)
CC: My PSA is elevated above the normal range.  HPI: Brett Trevino is a 62 year-old male patient who was referred by Dr. Judd Lien, MD who is here for an elevated PSA.  His PSA is 13. He has had PSA's drawn prior to this one. He has not had elevated PSA's prior to this one. He indicates there were no urinary problems when the PSA was drawn. He has not had a prostate nodule on a physical examination. He has not had recurrent prostate infections or chronic prostatitis.   He has not been on antibiotics for prostate infections previously. He has not had a prostate biopsy done.   Brett Trevino is a 62 yo WM who is sent in consultation by Dr. Pleas Koch for an elevated PSA of 13 from 07/05/17. He has only lived in the area for 9 months and he has had PSA's in they had been low at about 1 1-70yrs ago. He has severe LUTS that has been present for some time but it has been progressive and his IPSS is 30. He has nocturia x 4. He has not had treatment for LUTs. He has a stone 3 years ago. He has had no UTI's or prostatitis. He has had no GU surgery. 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   He was hospitalized for N/V and abdominal pain about 2 weeks ago. CT suggested possible enteritis consistent with Crohn's. He is seeing Dr. Laural Golden.     AUA Symptom Score: Almost always he has the sensation of not emptying his bladder completely when finished urinating. More than 50% of the time he has to urinate again fewer than two hours after he has finished urinating. Almost always he has to start and stop again several times when he urinates. More than 50% of the time he finds it difficult to postpone urination. More than 50% of the time he has a weak urinary stream. More than 50% of the time he has to push or strain to begin urination. He has to get up to urinate 4 times from the time he goes to bed until the time he gets up in the morning.   Calculated AUA Symptom Score:  30    QOL Score: He would feel mostly dissatisfied if he had to live with his urinary condition the way it is now for the rest of his life.   Calculated QOL Symptom Score: 4    ALLERGIES: None   MEDICATIONS: Aspirin  Metformin Hcl  Atorvastatin Calcium  Magnesium  Multivitamin  Vitamin B12     GU PSH: None   NON-GU PSH: None   GU PMH: None   NON-GU PMH: Diabetes Type 2 GERD Hypercholesterolemia    FAMILY HISTORY: Colon Cancer - Runs in Family Diabetes - Runs in Family heart - Runs in Family   SOCIAL HISTORY: Marital Status: Married Preferred Language: English Current Smoking Status: Patient has never smoked.   Tobacco Use Assessment Completed: Used Tobacco in last 30 days? Has never drank.  Does not drink caffeine. Patient's occupation Engineer, maintenance.    REVIEW OF SYSTEMS:    GU Review Male:   Patient reports frequent urination, hard to postpone urination, get up at night to urinate, stream starts and stops, trouble starting your stream, have to strain to urinate , and erection problems. Patient denies burning/ pain with urination, leakage of urine, and penile pain.  Gastrointestinal (Upper):   Patient reports nausea, vomiting, and indigestion/  heartburn.   Gastrointestinal (Lower):   Patient denies diarrhea and constipation.  Constitutional:   Patient reports fatigue. Patient denies fever, night sweats, and weight loss.  Skin:   Patient reports itching. Patient denies skin rash/ lesion.  Eyes:   Patient denies blurred vision and double vision.  Ears/ Nose/ Throat:   Patient denies sore throat and sinus problems.  Hematologic/Lymphatic:   Patient denies swollen glands and easy bruising.  Cardiovascular:   Patient denies leg swelling and chest pains.  Respiratory:   Patient denies cough and shortness of breath.  Endocrine:   Patient reports excessive thirst.   Musculoskeletal:   Patient reports back pain. Patient denies joint pain.  Neurological:   Patient  reports dizziness. Patient denies headaches.  Psychologic:   Patient denies depression and anxiety.   VITAL SIGNS:      08/31/2017 09:13 AM  Weight 242 lb / 109.77 kg  Height 67 in / 170.18 cm  BP 130/75 mmHg  Pulse 83 /min  Temperature 98.5 F / 36.9 C  BMI 37.9 kg/m   GU PHYSICAL EXAMINATION:    Anus and Perineum: Moderate anal stenosis. No hemorrhoids. No rectal fissure, no anal fissure. No edema, no dimple, no perineal tenderness, no anal tenderness.   Scrotum: No lesions. No edema. No cysts. No warts.  Epididymides: Right: right head contains a spermatocele. Right: no masses, no cysts, no tenderness, no induration, no enlargement. Left: No spermatocele, no masses, no cysts, no tenderness, no induration, no enlargement.   Testes: No tenderness, no swelling, no enlargement left testes. No tenderness, no swelling, no enlargement right testes. Normal location left testes. Normal location right testes. No mass, no cyst, no varicocele, no hydrocele left testes. No mass, no cyst, no varicocele, no hydrocele right testes.  Urethral Meatus: Normal size. No lesion, no wart, no discharge, no polyp. Normal location.  Penis: Circumcised, no warts, no cracks. No dorsal Peyronie's plaques, no left corporal Peyronie's plaques, no right corporal Peyronie's plaques, no scarring, no warts. No balanitis, no meatal stenosis.  Prostate: Prostate 2 + size. Left lobe firm, right lobe firm. Symmetrical lobes. No prostate nodule. Left lobe no tenderness, right lobe no tenderness.   Seminal Vesicles: Nonpalpable.  Sphincter Tone: Normal sphincter. No rectal tenderness. No rectal mass.    MULTI-SYSTEM PHYSICAL EXAMINATION:    Constitutional: Obese. No physical deformities. Normally developed. Good grooming.   Neck: Neck symmetrical, not swollen. Normal tracheal position.  Respiratory: No labored breathing, no use of accessory muscles. Normal breath sounds.  Cardiovascular: Regular rate and rhythm. No murmur, no  gallop.   Lymphatic: No enlargement, no tenderness of axillae, supraclavicular, groin, neck lymph nodes.  Skin: No paleness, no jaundice, no cyanosis. No lesion, no ulcer, no rash.  Neurologic / Psychiatric: Oriented to time, oriented to place, oriented to person. No depression, no anxiety, no agitation.  Gastrointestinal: Abdominal tenderness, mild, obese. No hernia. No mass, no rigidity.   Eyes: Normal conjunctivae. Normal eyelids.  Ears, Nose, Mouth, and Throat: Left ear no scars, no lesions, no masses. Right ear no scars, no lesions, no masses. Nose no scars, no lesions, no masses. Normal hearing. Normal lips.  Musculoskeletal: Normal gait and station of head and neck.     PAST DATA REVIEWED:  Source Of History:  Patient  Lab Test Review:   PSA  Records Review:   AUA Symptom Score, Previous Doctor Records  Urine Test Review:   Urinalysis  X-Ray Review: C.T. Abdomen/Pelvis: Reviewed Films. Reviewed Report. Discussed With  Patient. Possible Crohn's. prostate was not too big but has some hyperechoic areas primarily on the left.     07/05/17  PSA  Total PSA 13 ng/dl   Notes:                     I have reviewed records from Dr. Pleas Koch and also from Dr. Olevia Perches office. They are just getting started on the Crohn's eval. Patient's sed rate was 9.    PROCEDURES:           PVR Ultrasound - 41937  Scanned Volume: 16.3 cc         Urinalysis - 81003 Dipstick Dipstick Cont'd  Specimen: Voided Bilirubin: Neg  Color: Yellow Ketones: Neg  Appearance: Clear Blood: Neg  Specific Gravity: 1.025 Protein: Trace  pH: 6.0 Urobilinogen: 0.2  Glucose: Neg Nitrites: Neg    Leukocyte Esterase: Trace    ASSESSMENT:      ICD-10 Details  1 GU:   Elevated PSA - R97.20 His PSA jumped from 1 to 13 over the last year or so. I have found these rapid increases can often be erroneous or related to non-malignant causes. I will repeat the PSA today and if it remains elevated, I will arrange a biopsy. He has  some anal stenosis and will need valium for the biopsy. I reviewed the risks of bleeding, infection and voiding difficulty should the biopsy be required.   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 I discussed consideration of medical therapy but doesn't want to pursue that at this time.   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 We can address that in the future if he desires.    PLAN:           Orders Labs PSA with Reflex          Schedule Return Visit/Planned Activity: 3 Months - Office Visit             Note: I will call with the results of the PSA and if it is falling I will have him return in 3 months with another check. If it remains elevated, he will need a biopsy.           Document Letter(s):  Created for Patient: Clinical Summary

## 2017-09-28 NOTE — Discharge Instructions (Addendum)
Transrectal Ultrasound-Guided Biopsy A transrectal ultrasound-guided biopsy is a procedure to remove samples of tissue from your prostate using ultrasound images to guide the procedure. The procedure is usually done to evaluate the prostate gland of men who have an elevated prostate-specific antigen (PSA). PSA is a blood test to screen for prostate cancer. The biopsy samples are taken to check for prostate cancer. Tell a health care provider about:  Any allergies you have.  All medicines you are taking, including vitamins, herbs, eye drops, creams, and over-the-counter medicines.  Any problems you or family members have had with anesthetic medicines.  Any blood disorders you have.  Any surgeries you have had.  Any medical conditions you have. What are the risks? Generally, this is a safe procedure. However, as with any procedure, problems can occur. Possible problems include:  Infection of your prostate.  Bleeding from your rectum or blood in your urine.  Difficulty urinating.  Nerve damage (this is usually temporary).  Damage to surrounding structures such as blood vessels, organs, and muscles, which would require other procedures.  What happens before the procedure?  Do not eat or drink anything after midnight on the night before the procedure or as directed by your health care provider.  Take medicines only as directed by your health care provider.  Your health care provider may have you stop taking certain medicines 5-7 days before the procedure.  You will be given an enema before the procedure. During an enema, a liquid is injected into your rectum to clear out waste.  You may have lab tests the day of your procedure.  Plan to have someone take you home after the procedure. What happens during the procedure?  You will be given medicine to help you relax (sedative) before the procedure. An IV tube will be inserted into one of your veins and used to give fluids and  medicine.  You will be given antibiotic medicine to reduce the risk of an infection.  You will be placed on your side for the procedure.  A probe with lubricated gel will be placed into your rectum, and images will be taken of your prostate and surrounding structures.  Numbing medicine will be injected into the prostate before the biopsy samples are taken.  A biopsy needle will then be inserted and guided to your prostate with the use of the ultrasound images.  Samples of prostate tissue will be taken, and the needle will then be removed.  The biopsy samples will be sent to a lab to be analyzed. Results are usually back in 2-3 days. What happens after the procedure?  You will be taken to a recovery area where you will be monitored.  You may have some discomfort in the rectal area. You will be given pain medicines to control this.  You may be allowed to go home the same day, or you may need to stay in the hospital overnight.  Please call if you have fever >101, heavy bleeding or difficulty urinating.   I will call with the biopsy results.   This information is not intended to replace advice given to you by your health care provider. Make sure you discuss any questions you have with your health care provider. Document Released: 08/04/2013 Document Revised: 08/26/2015 Document Reviewed: 11/06/2012 Elsevier Interactive Patient Education  2018 [place] Anesthesia, Adult, Care After These instructions provide you with information about caring for yourself after your procedure. Your health care provider may also give you more  specific instructions. Your treatment has been planned according to current medical practices, but problems sometimes occur. Call your health care provider if you have any problems or questions after your procedure. What can I expect after the procedure? After the procedure, it is common to have:  Vomiting.  A sore throat.  Mental slowness.  It  is common to feel:  Nauseous.  Cold or shivery.  Sleepy.  Tired.  Sore or achy, even in parts of your body where you did not have surgery.  Follow these instructions at home: For at least 24 hours after the procedure:  Do not: ? Participate in activities where you could fall or become injured. ? Drive. ? Use heavy machinery. ? Drink alcohol. ? Take sleeping pills or medicines that cause drowsiness. ? Make important decisions or sign legal documents. ? Take care of children on your own.  Rest. Eating and drinking  If you vomit, drink water, juice, or soup when you can drink without vomiting.  Drink enough fluid to keep your urine clear or pale yellow.  Make sure you have little or no nausea before eating solid foods.  Follow the diet recommended by your health care provider. General instructions  Have a responsible adult stay with you until you are awake and alert.  Return to your normal activities as told by your health care provider. Ask your health care provider what activities are safe for you.  Take over-the-counter and prescription medicines only as told by your health care provider.  If you smoke, do not smoke without supervision.  Keep all follow-up visits as told by your health care provider. This is important. Contact a health care provider if:  You continue to have nausea or vomiting at home, and medicines are not helpful.  You cannot drink fluids or start eating again.  You cannot urinate after 8-12 hours.  You develop a skin rash.  You have fever.  You have increasing redness at the site of your procedure. Get help right away if:  You have difficulty breathing.  You have chest pain.  You have unexpected bleeding.  You feel that you are having a life-threatening or urgent problem. This information is not intended to replace advice given to you by your health care provider. Make sure you discuss any questions you have with your health care  provider. Document Released: 06/26/2000 Document Revised: 08/23/2015 Document Reviewed: 03/04/2015 Elsevier Interactive Patient Education  [employer].

## 2017-09-28 NOTE — Anesthesia Postprocedure Evaluation (Signed)
Anesthesia Post Note  Patient: Brett Trevino  Procedure(s) Performed: BIOPSY TRANSRECTAL ULTRASONIC PROSTATE (TUBP) (N/A Rectum)  Patient location during evaluation: PACU Anesthesia Type: MAC Level of consciousness: awake and alert Pain management: pain level controlled Vital Signs Assessment: post-procedure vital signs reviewed and stable Respiratory status: spontaneous breathing Cardiovascular status: blood pressure returned to baseline Postop Assessment: no apparent nausea or vomiting Anesthetic complications: no     Last Vitals:  Vitals:   09/28/17 1024 09/28/17 1235  BP: 134/82 96/63  Pulse: 85 64  Resp: 15 11  Temp: 36.7 C 36.6 C  SpO2: 95% 93%    Last Pain:  Vitals:   09/28/17 1235  TempSrc:   PainSc: Asleep                 [name]

## 2017-09-28 NOTE — Progress Notes (Signed)
Patient "feel strange", skin clammy. Patient alert and oriented. Placed in reclining chair. Dr. Rick Duff in to see patient.

## 2017-09-28 NOTE — Progress Notes (Signed)
Patient states "I feel much better".

## 2017-09-28 NOTE — Anesthesia Procedure Notes (Signed)
Date/Time: 09/28/2017 12:15 PM Performed by: Talbot Grumbling, CRNA Oxygen Delivery Method: Non-rebreather mask

## 2017-10-01 ENCOUNTER — Encounter: Payer: Self-pay | Admitting: Family Medicine

## 2017-10-01 ENCOUNTER — Encounter (HOSPITAL_COMMUNITY): Payer: Self-pay | Admitting: Urology

## 2017-10-01 DIAGNOSIS — E119 Type 2 diabetes mellitus without complications: Secondary | ICD-10-CM | POA: Diagnosis not present

## 2017-10-02 DIAGNOSIS — H6122 Impacted cerumen, left ear: Secondary | ICD-10-CM | POA: Diagnosis not present

## 2017-10-05 ENCOUNTER — Ambulatory Visit: Payer: BLUE CROSS/BLUE SHIELD | Admitting: Urology

## 2017-10-09 DIAGNOSIS — I700 Atherosclerosis of aorta: Secondary | ICD-10-CM | POA: Diagnosis not present

## 2017-10-09 DIAGNOSIS — E782 Mixed hyperlipidemia: Secondary | ICD-10-CM | POA: Diagnosis not present

## 2017-10-09 DIAGNOSIS — E119 Type 2 diabetes mellitus without complications: Secondary | ICD-10-CM | POA: Diagnosis not present

## 2017-10-09 DIAGNOSIS — K760 Fatty (change of) liver, not elsewhere classified: Secondary | ICD-10-CM | POA: Diagnosis not present

## 2017-10-12 ENCOUNTER — Other Ambulatory Visit: Payer: Self-pay | Admitting: Urology

## 2017-10-12 DIAGNOSIS — C61 Malignant neoplasm of prostate: Secondary | ICD-10-CM

## 2017-10-16 ENCOUNTER — Encounter (HOSPITAL_COMMUNITY): Payer: Self-pay

## 2017-10-16 ENCOUNTER — Encounter (HOSPITAL_COMMUNITY)
Admission: RE | Admit: 2017-10-16 | Discharge: 2017-10-16 | Disposition: A | Discharge status: Home / Self Care | Payer: BLUE CROSS/BLUE SHIELD | Source: Ambulatory Visit | Attending: Urology | Admitting: Urology

## 2017-10-16 DIAGNOSIS — C61 Malignant neoplasm of prostate: Secondary | ICD-10-CM

## 2017-10-16 MED ORDER — TECHNETIUM TC 99M MEDRONATE IV KIT
20.0000 | PACK | Freq: Once | INTRAVENOUS | Status: AC | PRN
  Administered 2017-10-16: 21 via INTRAVENOUS

## 2017-10-26 ENCOUNTER — Institutional Professional Consult (permissible substitution) (INDEPENDENT_AMBULATORY_CARE_PROVIDER_SITE_OTHER): Payer: BLUE CROSS/BLUE SHIELD | Admitting: Urology

## 2017-10-26 DIAGNOSIS — N401 Enlarged prostate with lower urinary tract symptoms: Secondary | ICD-10-CM

## 2017-10-26 DIAGNOSIS — R972 Elevated prostate specific antigen [PSA]: Secondary | ICD-10-CM

## 2017-10-26 DIAGNOSIS — C61 Malignant neoplasm of prostate: Secondary | ICD-10-CM | POA: Diagnosis not present

## 2017-10-30 DIAGNOSIS — C61 Malignant neoplasm of prostate: Secondary | ICD-10-CM | POA: Diagnosis not present

## 2017-11-09 DIAGNOSIS — M6281 Muscle weakness (generalized): Secondary | ICD-10-CM | POA: Diagnosis not present

## 2017-11-09 DIAGNOSIS — C61 Malignant neoplasm of prostate: Secondary | ICD-10-CM | POA: Diagnosis not present

## 2017-11-13 ENCOUNTER — Other Ambulatory Visit: Payer: Self-pay | Admitting: Urology

## 2017-11-23 NOTE — Patient Instructions (Addendum)
Rykar Lebleu  11/23/2017   Your procedure is scheduled on: 12-06-17   Report to [facility] Main  Entrance    Report to admitting at 9:15 AM    Call this number if you have problems the morning of surgery [phone]   Remember: Do not eat food or drink liquids :After Midnight.     Take these medicines the morning of surgery with A SIP OF WATER: None                                 You may not have any metal on your body including hair pins and              piercings  Do not wear jewelry, lotions, powders, cologne or deodorant             Men may shave face and neck.   Do not bring valuables to the hospital. [place].  Contacts, dentures or bridgework may not be worn into surgery.  Leave suitcase in the car. After surgery it may be brought to your room.      Special Instructions: N/A              Please read over the following fact sheets you were given: _____________________________________________________________________          How to Manage Your Diabetes Before and After Surgery  Why is it important to control my blood sugar before and after surgery? . Improving blood sugar levels before and after surgery helps healing and can limit problems. . A way of improving blood sugar control is eating a healthy diet by: o  Eating less sugar and carbohydrates o  Increasing activity/exercise o  Talking with your doctor about reaching your blood sugar goals . High blood sugars (greater than 180 mg/dL) can raise your risk of infections and slow your recovery, so you will need to focus on controlling your diabetes during the weeks before surgery. . Make sure that the doctor who takes care of your diabetes knows about your planned surgery including the date and location.  How do I manage my blood sugar before surgery? . Check your blood sugar at least 4 times a day, starting 2 days before surgery, to make  sure that the level is not too high or low. o Check your blood sugar the morning of your surgery when you wake up and every 2 hours until you get to the Short Stay unit. . If your blood sugar is less than 70 mg/dL, you will need to treat for low blood sugar: o Do not take insulin. o Treat a low blood sugar (less than 70 mg/dL) with  cup of clear juice (cranberry or apple), 4 glucose tablets, OR glucose gel. o Recheck blood sugar in 15 minutes after treatment (to make sure it is greater than 70 mg/dL). If your blood sugar is not greater than 70 mg/dL on recheck, call [phone] for further instructions. . Report your blood sugar to the short stay nurse when you get to Short Stay.  . If you are admitted to the hospital after surgery: o Your blood sugar will be checked by the staff and you will probably be given insulin after surgery (instead of  oral diabetes medicines) to make sure you have good blood sugar levels. o The goal for blood sugar control after surgery is 80-180 mg/dL.   WHAT DO I DO ABOUT MY DIABETES MEDICATION?  Marland Kitchen Do not take oral diabetes medicines (pills) the morning of surgery.  . THE DAY BEFORE SURGERY, take your usual dose of Metformin      [facility] - Preparing for Surgery Before surgery, you can play an important role.  Because skin is not sterile, your skin needs to be as free of germs as possible.  You can reduce the number of germs on your skin by washing with CHG (chlorahexidine gluconate) soap before surgery.  CHG is an antiseptic cleaner which kills germs and bonds with the skin to continue killing germs even after washing. Please DO NOT use if you have an allergy to CHG or antibacterial soaps.  If your skin becomes reddened/irritated stop using the CHG and inform your nurse when you arrive at Short Stay. Do not shave (including legs and underarms) for at least 48 hours prior to the first CHG shower.  You may shave your face/neck. Please follow these instructions  carefully:  1.  Shower with CHG Soap the night before surgery and the  morning of Surgery.  2.  If you choose to wash your hair, wash your hair first as usual with your  normal  shampoo.  3.  After you shampoo, rinse your hair and body thoroughly to remove the  shampoo.                           4.  Use CHG as you would any other liquid soap.  You can apply chg directly  to the skin and wash                       Gently with a scrungie or clean washcloth.  5.  Apply the CHG Soap to your body ONLY FROM THE NECK DOWN.   Do not use on face/ open                           Wound or open sores. Avoid contact with eyes, ears mouth and genitals (private parts).                       Wash face,  Genitals (private parts) with your normal soap.             6.  Wash thoroughly, paying special attention to the area where your surgery  will be performed.  7.  Thoroughly rinse your body with warm water from the neck down.  8.  DO NOT shower/wash with your normal soap after using and rinsing off  the CHG Soap.                9.  Pat yourself dry with a clean towel.            10.  Wear clean pajamas.            11.  Place clean sheets on your bed the night of your first shower and do not  sleep with pets. Day of Surgery : Do not apply any lotions/deodorants the morning of surgery.  Please wear clean clothes to the hospital/surgery center.  FAILURE TO FOLLOW THESE INSTRUCTIONS MAY RESULT IN THE CANCELLATION OF YOUR SURGERY PATIENT  SIGNATURE_________________________________  NURSE SIGNATURE__________________________________  ________________________________________________________________________

## 2017-11-26 ENCOUNTER — Encounter (HOSPITAL_COMMUNITY)
Admission: RE | Admit: 2017-11-26 | Discharge: 2017-11-26 | Disposition: A | Discharge status: Home / Self Care | Payer: BLUE CROSS/BLUE SHIELD | Source: Ambulatory Visit | Attending: Urology | Admitting: Urology

## 2017-11-26 ENCOUNTER — Encounter (HOSPITAL_COMMUNITY): Payer: Self-pay

## 2017-11-26 ENCOUNTER — Other Ambulatory Visit: Payer: Self-pay

## 2017-11-26 DIAGNOSIS — C61 Malignant neoplasm of prostate: Secondary | ICD-10-CM | POA: Insufficient documentation

## 2017-11-26 DIAGNOSIS — M6281 Muscle weakness (generalized): Secondary | ICD-10-CM | POA: Diagnosis not present

## 2017-11-26 DIAGNOSIS — Z0183 Encounter for blood typing: Secondary | ICD-10-CM | POA: Insufficient documentation

## 2017-11-26 DIAGNOSIS — Z01812 Encounter for preprocedural laboratory examination: Secondary | ICD-10-CM | POA: Insufficient documentation

## 2017-11-26 LAB — BASIC METABOLIC PANEL
ANION GAP: 11 (ref 5–15)
BUN: 9 mg/dL (ref 8–23)
CHLORIDE: 108 mmol/L (ref 98–111)
CO2: 26 mmol/L (ref 22–32)
Calcium: 9.1 mg/dL (ref 8.9–10.3)
Creatinine, Ser: 0.92 mg/dL (ref 0.61–1.24)
GFR calc Af Amer: >60 mL/min (ref >60)
GFR calc non Af Amer: >60 mL/min (ref >60)
GLUCOSE: 113 mg/dL — AB (ref 70–99)
POTASSIUM: 3.6 mmol/L (ref 3.5–5.1)
Sodium: 145 mmol/L (ref 135–145)

## 2017-11-26 LAB — CBC
HEMATOCRIT: 42.1 % (ref 39.0–52.0)
HEMOGLOBIN: 14.7 g/dL (ref 13.0–17.0)
MCH: 31.4 pg (ref 26.0–34.0)
MCHC: 34.9 g/dL (ref 30.0–36.0)
MCV: 90 fL (ref 78.0–100.0)
Platelets: 242 10*3/uL (ref 150–400)
RBC: 4.68 MIL/uL (ref 4.22–5.81)
RDW: 13.9 % (ref 11.5–15.5)
WBC: 6.1 10*3/uL (ref 4.0–10.5)

## 2017-11-26 LAB — HEMOGLOBIN A1C
Hgb A1c MFr Bld: 5.8 % — ABNORMAL HIGH (ref 4.8–5.6)
Mean Plasma Glucose: 119.76 mg/dL

## 2017-11-26 LAB — GLUCOSE, CAPILLARY: Glucose-Capillary: 120 mg/dL — ABNORMAL HIGH (ref 70–99)

## 2017-11-26 NOTE — Progress Notes (Signed)
09-25-17 (Epic) EKG 

## 2017-11-27 LAB — ABO/RH: ABO/RH(D): A NEG

## 2017-12-05 NOTE — H&P (Signed)
CC/HPI: CC: Prostate Cancer     Mr. Dehner is a 62 year old gentleman who was found to have an elevated PSA of 17.8. Due to anal stenosis, he underwent a biopsy in the OR on 09/28/17 that demonstrated Gleason 4+4=8 adenocarcinoma of the prostate with 3 out of 12 biopsy cores positive for malignancy. Staging studies were negative.   Family history: None.   Imaging studies:  CT scan (09/20/17): No LAD.  Bone scan (10/16/17): Negative for metastatic disease.   PMH: He has a history of GERD, hypertension, and diabetes.  PSH: No abdominal surgeries.   TNM stage: cT1c N0 M0  PSA: 17.8  Gleason score: 4+4=8  Biopsy (09/28/17): 3/12 cores positive  Left: L lateral apex (40%, 4+3=7), L apex (50%, 4+4=8), L mid (40%, 4+4=8)  Right: Benign  Prostate volume: 21.0 cc   Nomogram  OC disease: 22%  EPE: 75%  SVI: 16%  LNI: 18%   Urinary function: IPSS is 19.  Erectile function: SHIM score is 6. He has severe erectile dysfunction that has not been responsive to oral medical therapy.     ALLERGIES: None   MEDICATIONS: Aspirin  Metformin Hcl  Atorvastatin Calcium  Magnesium  Multivitamin  Vitamin B12     GU PSH: None   NON-GU PSH: None   GU PMH: Prostate Cancer, T1c N0 M0 Gleason 8 high risk prostate cancer with severe LUTs and a 18ml prostate. I have discussed surgery vs radiation therapy with 2 years of ADT. With his LUTS, I believe he will be best served with primary surgical therapy, but if he is interested in XRT, I would want to reassess voiding after 2-3 months of ADT prior to beginning ADT. After reviewing the options he would like to be referred for Prostatectomy. - 10/26/2017 BPH w/LUTS, He has severe LUTS but is empting well. I discussed consideration of medical therapy but doesn't want to pursue that at this time. - 08/31/2017 ED due to arterial insufficiency, He has progressive severe ED. We can address that in the future if he desires. - 08/31/2017 Elevated PSA, His PSA  jumped from 1 to 13 over the last year or so. I have found these rapid increases can often be erroneous or related to non-malignant causes. I will repeat the PSA today and if it remains elevated, I will arrange a biopsy. He has some anal stenosis and will need valium for the biopsy. I reviewed the risks of bleeding, infection and voiding difficulty should the biopsy be required. - 08/31/2017 Nocturia - 08/31/2017    NON-GU PMH: Diabetes Type 2 GERD Hypercholesterolemia    FAMILY HISTORY: Colon Cancer - Runs in Family Diabetes - Runs in Family heart - Runs in Family   SOCIAL HISTORY: Marital Status: Married Preferred Language: English Current Smoking Status: Patient has never smoked.   Tobacco Use Assessment Completed: Used Tobacco in last 30 days? Has never drank.  Does not drink caffeine. Patient's occupation Engineer, maintenance.    REVIEW OF SYSTEMS:    GU Review Male:   Patient reports frequent urination, burning/ pain with urination, get up at night to urinate, stream starts and stops, trouble starting your streams, and have to strain to urinate . Patient denies hard to postpone urination and leakage of urine.  Gastrointestinal (Lower):   Patient denies diarrhea and constipation.  Gastrointestinal (Upper):   Patient denies nausea and vomiting.  Constitutional:   Patient denies fever, night sweats, weight loss, and fatigue.  Skin:   Patient  denies skin rash/ lesion and itching.  Eyes:   Patient denies blurred vision and double vision.  Ears/ Nose/ Throat:   Patient denies sore throat and sinus problems.  Hematologic/Lymphatic:   Patient denies swollen glands and easy bruising.  Cardiovascular:   Patient denies leg swelling and chest pains.  Respiratory:   Patient denies cough and shortness of breath.  Endocrine:   Patient denies excessive thirst.  Musculoskeletal:   Patient denies back pain and joint pain.  Neurological:   Patient denies headaches and dizziness.  Psychologic:    Patient denies depression and anxiety.   VITAL SIGNS:     Weight 246 lb / 111.58 kg  Height 68 in / 172.72 cm  BMI 37.4 kg/m     MULTI-SYSTEM PHYSICAL EXAMINATION:    Constitutional: Well-nourished. No physical deformities. Normally developed. Good grooming.  Neck: Neck symmetrical, not swollen. Normal tracheal position.  Respiratory: No labored breathing, no use of accessory muscles. Clear bilaterally.  Cardiovascular: Normal temperature, normal extremity pulses, no swelling, no varicosities. Regular rate and rhythm.  Lymphatic: No enlargement of neck, axillae, groin.  Skin: No paleness, no jaundice, no cyanosis. No lesion, no ulcer, no rash.  Neurologic / Psychiatric: Oriented to time, oriented to place, oriented to person. No depression, no anxiety, no agitation.  Gastrointestinal: No mass, no tenderness, no rigidity, obese abdomen.  Eyes: Normal conjunctivae. Normal eyelids.  Ears, Nose, Mouth, and Throat: Left ear no scars, no lesions, no masses. Right ear no scars, no lesions, no masses. Nose no scars, no lesions, no masses. Normal hearing. Normal lips.  Musculoskeletal: Normal gait and station of head and neck.     ASSESSMENT:      ICD-10 Details  1 GU:   Prostate Cancer - C61    PLAN:      1. High-risk prostate cancer: He does wish to proceed with surgical treatment. He will be scheduled for a unilateral left nerve-sparing robot assisted laparoscopic radical prostatectomy and bilateral pelvic lymphadenectomy.

## 2017-12-06 ENCOUNTER — Observation Stay (HOSPITAL_COMMUNITY)
Admission: RE | Admit: 2017-12-06 | Discharge: 2017-12-07 | Disposition: A | Discharge status: Home / Self Care | Payer: BLUE CROSS/BLUE SHIELD | Source: Ambulatory Visit | Attending: Urology | Admitting: Urology

## 2017-12-06 ENCOUNTER — Other Ambulatory Visit: Payer: Self-pay

## 2017-12-06 ENCOUNTER — Encounter (HOSPITAL_COMMUNITY): Payer: Self-pay | Admitting: *Deleted

## 2017-12-06 ENCOUNTER — Encounter (HOSPITAL_COMMUNITY)
Admission: RE | Disposition: A | Discharge status: Home / Self Care | Payer: Self-pay | Source: Ambulatory Visit | Attending: Urology

## 2017-12-06 ENCOUNTER — Ambulatory Visit (HOSPITAL_COMMUNITY): Payer: BLUE CROSS/BLUE SHIELD | Admitting: Anesthesiology

## 2017-12-06 DIAGNOSIS — C61 Malignant neoplasm of prostate: Secondary | ICD-10-CM | POA: Diagnosis not present

## 2017-12-06 DIAGNOSIS — E7800 Pure hypercholesterolemia, unspecified: Secondary | ICD-10-CM | POA: Diagnosis not present

## 2017-12-06 DIAGNOSIS — K219 Gastro-esophageal reflux disease without esophagitis: Secondary | ICD-10-CM | POA: Insufficient documentation

## 2017-12-06 DIAGNOSIS — Z7982 Long term (current) use of aspirin: Secondary | ICD-10-CM | POA: Insufficient documentation

## 2017-12-06 DIAGNOSIS — Z79899 Other long term (current) drug therapy: Secondary | ICD-10-CM | POA: Insufficient documentation

## 2017-12-06 DIAGNOSIS — I10 Essential (primary) hypertension: Secondary | ICD-10-CM | POA: Insufficient documentation

## 2017-12-06 DIAGNOSIS — E119 Type 2 diabetes mellitus without complications: Secondary | ICD-10-CM | POA: Insufficient documentation

## 2017-12-06 DIAGNOSIS — Z7984 Long term (current) use of oral hypoglycemic drugs: Secondary | ICD-10-CM | POA: Insufficient documentation

## 2017-12-06 HISTORY — PX: LYMPHADENECTOMY: SHX5960

## 2017-12-06 HISTORY — PX: ROBOT ASSISTED LAPAROSCOPIC RADICAL PROSTATECTOMY: SHX5141

## 2017-12-06 LAB — GLUCOSE, CAPILLARY
GLUCOSE-CAPILLARY: 106 mg/dL — AB (ref 70–99)
GLUCOSE-CAPILLARY: 205 mg/dL — AB (ref 70–99)
Glucose-Capillary: 190 mg/dL — ABNORMAL HIGH (ref 70–99)

## 2017-12-06 LAB — TYPE AND SCREEN
ABO/RH(D): A NEG
ANTIBODY SCREEN: NEGATIVE

## 2017-12-06 LAB — HEMOGLOBIN AND HEMATOCRIT, BLOOD
HEMATOCRIT: 43.8 % (ref 39.0–52.0)
Hemoglobin: 15.1 g/dL (ref 13.0–17.0)

## 2017-12-06 SURGERY — XI ROBOTIC ASSISTED LAPAROSCOPIC RADICAL PROSTATECTOMY LEVEL 2
Anesthesia: General

## 2017-12-06 MED ORDER — PROPOFOL 10 MG/ML IV BOLUS
INTRAVENOUS | Status: AC
  Filled 2017-12-06: qty 40

## 2017-12-06 MED ORDER — BUPIVACAINE-EPINEPHRINE 0.5% -1:200000 IJ SOLN
INTRAMUSCULAR | Status: DC | PRN
  Administered 2017-12-06: 30 mL

## 2017-12-06 MED ORDER — PROPOFOL 10 MG/ML IV BOLUS
INTRAVENOUS | Status: DC | PRN
  Administered 2017-12-06: 180 mg via INTRAVENOUS

## 2017-12-06 MED ORDER — LACTATED RINGERS IV SOLN
INTRAVENOUS | Status: DC | PRN
  Administered 2017-12-06: 1000 mL

## 2017-12-06 MED ORDER — HEPARIN SODIUM (PORCINE) 1000 UNIT/ML IJ SOLN
INTRAMUSCULAR | Status: AC
  Filled 2017-12-06: qty 1

## 2017-12-06 MED ORDER — LIDOCAINE 2% (20 MG/ML) 5 ML SYRINGE
INTRAMUSCULAR | Status: DC | PRN
  Administered 2017-12-06: 50 mg via INTRAVENOUS

## 2017-12-06 MED ORDER — KETOROLAC TROMETHAMINE 15 MG/ML IJ SOLN
15.0000 mg | Freq: Four times a day (QID) | INTRAMUSCULAR | Status: DC
  Administered 2017-12-06 – 2017-12-07 (×2): 15 mg via INTRAVENOUS
  Filled 2017-12-06 (×2): qty 1

## 2017-12-06 MED ORDER — HYDROMORPHONE HCL 1 MG/ML IJ SOLN
INTRAMUSCULAR | Status: AC
  Filled 2017-12-06: qty 1

## 2017-12-06 MED ORDER — INSULIN ASPART 100 UNIT/ML ~~LOC~~ SOLN
0.0000 [IU] | SUBCUTANEOUS | Status: DC
  Administered 2017-12-07 (×2): 3 [IU] via SUBCUTANEOUS
  Administered 2017-12-07: 5 [IU] via SUBCUTANEOUS

## 2017-12-06 MED ORDER — TRAMADOL HCL 50 MG PO TABS: 50.0000 mg | ORAL_TABLET | Freq: Four times a day (QID) | ORAL | 0 refills | Status: DC | PRN

## 2017-12-06 MED ORDER — DEXAMETHASONE SODIUM PHOSPHATE 10 MG/ML IJ SOLN
INTRAMUSCULAR | Status: DC | PRN
  Administered 2017-12-06: 10 mg via INTRAVENOUS

## 2017-12-06 MED ORDER — DIPHENHYDRAMINE HCL 12.5 MG/5ML PO ELIX: 12.5000 mg | ORAL_SOLUTION | Freq: Four times a day (QID) | ORAL | Status: DC | PRN

## 2017-12-06 MED ORDER — CEFAZOLIN SODIUM-DEXTROSE 1-4 GM/50ML-% IV SOLN
1.0000 g | Freq: Three times a day (TID) | INTRAVENOUS | Status: AC
  Administered 2017-12-06 – 2017-12-07 (×2): 1 g via INTRAVENOUS
  Filled 2017-12-06 (×2): qty 50

## 2017-12-06 MED ORDER — MEPERIDINE HCL 50 MG/ML IJ SOLN: 6.2500 mg | INTRAMUSCULAR | Status: DC | PRN

## 2017-12-06 MED ORDER — HYDROMORPHONE HCL 2 MG/ML IJ SOLN
INTRAMUSCULAR | Status: AC
  Filled 2017-12-06: qty 1

## 2017-12-06 MED ORDER — CEFAZOLIN SODIUM-DEXTROSE 2-4 GM/100ML-% IV SOLN
2.0000 g | Freq: Once | INTRAVENOUS | Status: AC
  Administered 2017-12-06: 2 g via INTRAVENOUS
  Filled 2017-12-06: qty 100

## 2017-12-06 MED ORDER — BACITRACIN-NEOMYCIN-POLYMYXIN 400-5-5000 EX OINT: 1.0000 "application " | TOPICAL_OINTMENT | Freq: Three times a day (TID) | CUTANEOUS | Status: DC | PRN

## 2017-12-06 MED ORDER — LACTATED RINGERS IV SOLN
INTRAVENOUS | Status: DC
  Administered 2017-12-06: 10:00:00 via INTRAVENOUS

## 2017-12-06 MED ORDER — FENTANYL CITRATE (PF) 250 MCG/5ML IJ SOLN
INTRAMUSCULAR | Status: AC
  Filled 2017-12-06: qty 5

## 2017-12-06 MED ORDER — ATORVASTATIN CALCIUM 10 MG PO TABS
10.0000 mg | ORAL_TABLET | Freq: Every evening | ORAL | Status: DC
  Administered 2017-12-06: 10 mg via ORAL
  Filled 2017-12-06: qty 1

## 2017-12-06 MED ORDER — SUGAMMADEX SODIUM 500 MG/5ML IV SOLN
INTRAVENOUS | Status: DC | PRN
  Administered 2017-12-06: 225 mg via INTRAVENOUS

## 2017-12-06 MED ORDER — DIPHENHYDRAMINE HCL 50 MG/ML IJ SOLN: 12.5000 mg | Freq: Four times a day (QID) | INTRAMUSCULAR | Status: DC | PRN

## 2017-12-06 MED ORDER — HYDROMORPHONE HCL 1 MG/ML IJ SOLN
0.2500 mg | INTRAMUSCULAR | Status: DC | PRN
  Administered 2017-12-06 (×3): 0.5 mg via INTRAVENOUS

## 2017-12-06 MED ORDER — DOCUSATE SODIUM 100 MG PO CAPS
100.0000 mg | ORAL_CAPSULE | Freq: Two times a day (BID) | ORAL | Status: DC
  Administered 2017-12-06 – 2017-12-07 (×2): 100 mg via ORAL
  Filled 2017-12-06 (×2): qty 1

## 2017-12-06 MED ORDER — ACETAMINOPHEN 325 MG PO TABS
650.0000 mg | ORAL_TABLET | ORAL | Status: DC | PRN
  Administered 2017-12-07: 650 mg via ORAL
  Filled 2017-12-06: qty 2

## 2017-12-06 MED ORDER — GLYCOPYRROLATE PF 0.2 MG/ML IJ SOSY
PREFILLED_SYRINGE | INTRAMUSCULAR | Status: DC | PRN
  Administered 2017-12-06: .2 mg via INTRAVENOUS

## 2017-12-06 MED ORDER — SODIUM CHLORIDE 0.9 % IV BOLUS
1000.0000 mL | Freq: Once | INTRAVENOUS | Status: AC
  Administered 2017-12-06: 1000 mL via INTRAVENOUS

## 2017-12-06 MED ORDER — BUPIVACAINE-EPINEPHRINE (PF) 0.5% -1:200000 IJ SOLN
INTRAMUSCULAR | Status: AC
  Filled 2017-12-06: qty 30

## 2017-12-06 MED ORDER — MORPHINE SULFATE (PF) 2 MG/ML IV SOLN: 2.0000 mg | INTRAVENOUS | Status: DC | PRN

## 2017-12-06 MED ORDER — ROCURONIUM BROMIDE 10 MG/ML (PF) SYRINGE
PREFILLED_SYRINGE | INTRAVENOUS | Status: DC | PRN
  Administered 2017-12-06: 50 mg via INTRAVENOUS
  Administered 2017-12-06: 20 mg via INTRAVENOUS

## 2017-12-06 MED ORDER — ONDANSETRON HCL 4 MG/2ML IJ SOLN
INTRAMUSCULAR | Status: DC | PRN
  Administered 2017-12-06: 4 mg via INTRAVENOUS

## 2017-12-06 MED ORDER — ONDANSETRON HCL 4 MG/2ML IJ SOLN: 4.0000 mg | INTRAMUSCULAR | Status: DC | PRN

## 2017-12-06 MED ORDER — SULFAMETHOXAZOLE-TRIMETHOPRIM 800-160 MG PO TABS: 1.0000 | ORAL_TABLET | Freq: Two times a day (BID) | ORAL | 0 refills | Status: DC

## 2017-12-06 MED ORDER — SODIUM CHLORIDE 0.9 % IR SOLN
Status: DC | PRN
  Administered 2017-12-06: 1000 mL

## 2017-12-06 MED ORDER — FENTANYL CITRATE (PF) 100 MCG/2ML IJ SOLN
INTRAMUSCULAR | Status: DC | PRN
  Administered 2017-12-06 (×2): 50 ug via INTRAVENOUS
  Administered 2017-12-06: 100 ug via INTRAVENOUS
  Administered 2017-12-06: 50 ug via INTRAVENOUS

## 2017-12-06 MED ORDER — PROMETHAZINE HCL 25 MG/ML IJ SOLN: 6.2500 mg | INTRAMUSCULAR | Status: DC | PRN

## 2017-12-06 MED ORDER — HYDROMORPHONE HCL 1 MG/ML IJ SOLN
INTRAMUSCULAR | Status: DC | PRN
  Administered 2017-12-06 (×4): 0.5 mg via INTRAVENOUS

## 2017-12-06 MED ORDER — POTASSIUM CHLORIDE IN NACL 20-0.45 MEQ/L-% IV SOLN
INTRAVENOUS | Status: DC
  Administered 2017-12-06 – 2017-12-07 (×2): via INTRAVENOUS
  Filled 2017-12-06 (×3): qty 1000

## 2017-12-06 SURGICAL SUPPLY — 53 items
APPLICATOR COTTON TIP 6 STRL (MISCELLANEOUS) ×2 IMPLANT
APPLICATOR COTTON TIP 6IN STRL (MISCELLANEOUS) ×3
CATH FOLEY 2WAY SLVR 18FR 30CC (CATHETERS) ×3 IMPLANT
CATH ROBINSON RED A/P 16FR (CATHETERS) ×3 IMPLANT
CATH ROBINSON RED A/P 8FR (CATHETERS) ×3 IMPLANT
CATH TIEMANN FOLEY 18FR 5CC (CATHETERS) ×3 IMPLANT
CHLORAPREP W/TINT 26ML (MISCELLANEOUS) ×3 IMPLANT
CLIP VESOLOCK LG 6/CT PURPLE (CLIP) ×6 IMPLANT
COVER SURGICAL LIGHT HANDLE (MISCELLANEOUS) ×3 IMPLANT
COVER TIP SHEARS 8 DVNC (MISCELLANEOUS) ×2 IMPLANT
COVER TIP SHEARS 8MM DA VINCI (MISCELLANEOUS) ×1
CUTTER ECHEON FLEX ENDO 45 340 (ENDOMECHANICALS) ×3 IMPLANT
DECANTER SPIKE VIAL GLASS SM (MISCELLANEOUS) ×3 IMPLANT
DERMABOND ADVANCED (GAUZE/BANDAGES/DRESSINGS) ×1
DERMABOND ADVANCED .7 DNX12 (GAUZE/BANDAGES/DRESSINGS) ×2 IMPLANT
DRAPE ARM DVNC X/XI (DISPOSABLE) ×8 IMPLANT
DRAPE COLUMN DVNC XI (DISPOSABLE) ×2 IMPLANT
DRAPE DA VINCI XI ARM (DISPOSABLE) ×4
DRAPE DA VINCI XI COLUMN (DISPOSABLE) ×1
DRAPE SURG IRRIG POUCH 19X23 (DRAPES) ×3 IMPLANT
DRSG TEGADERM 4X4.75 (GAUZE/BANDAGES/DRESSINGS) ×3 IMPLANT
ELECT REM PT RETURN 15FT ADLT (MISCELLANEOUS) ×3 IMPLANT
GLOVE BIO SURGEON STRL SZ 6.5 (GLOVE) ×3 IMPLANT
GLOVE BIOGEL M STRL SZ7.5 (GLOVE) ×6 IMPLANT
GOWN STRL REUS W/TWL LRG LVL3 (GOWN DISPOSABLE) ×9 IMPLANT
HOLDER FOLEY CATH W/STRAP (MISCELLANEOUS) ×3 IMPLANT
IRRIG SUCT STRYKERFLOW 2 WTIP (MISCELLANEOUS) ×3
IRRIGATION SUCT STRKRFLW 2 WTP (MISCELLANEOUS) ×2 IMPLANT
IV LACTATED RINGERS 1000ML (IV SOLUTION) IMPLANT
NDL SAFETY ECLIPSE 18X1.5 (NEEDLE) ×2 IMPLANT
NEEDLE HYPO 18GX1.5 SHARP (NEEDLE) ×1
PACK ROBOT UROLOGY CUSTOM (CUSTOM PROCEDURE TRAY) ×3 IMPLANT
SEAL CANN UNIV 5-8 DVNC XI (MISCELLANEOUS) ×8 IMPLANT
SEAL XI 5MM-8MM UNIVERSAL (MISCELLANEOUS) ×4
SOLUTION ELECTROLUBE (MISCELLANEOUS) ×3 IMPLANT
STAPLE RELOAD 45 GRN (STAPLE) ×2 IMPLANT
STAPLE RELOAD 45MM GREEN (STAPLE) ×1
SUT ETHILON 3 0 PS 1 (SUTURE) ×3 IMPLANT
SUT MNCRL 3 0 RB1 (SUTURE) ×2 IMPLANT
SUT MNCRL 3 0 VIOLET RB1 (SUTURE) ×2 IMPLANT
SUT MNCRL AB 4-0 PS2 18 (SUTURE) ×6 IMPLANT
SUT MONOCRYL 3 0 RB1 (SUTURE) ×2
SUT VIC AB 0 CT1 27 (SUTURE) ×1
SUT VIC AB 0 CT1 27XBRD ANTBC (SUTURE) ×2 IMPLANT
SUT VIC AB 0 UR5 27 (SUTURE) ×3 IMPLANT
SUT VIC AB 2-0 SH 27 (SUTURE) ×1
SUT VIC AB 2-0 SH 27X BRD (SUTURE) ×2 IMPLANT
SUT VICRYL 0 UR6 27IN ABS (SUTURE) ×6 IMPLANT
SYR 27GX1/2 1ML LL SAFETY (SYRINGE) ×3 IMPLANT
TOWEL OR 17X26 10 PK STRL BLUE (TOWEL DISPOSABLE) ×3 IMPLANT
TOWEL OR NON WOVEN STRL DISP B (DISPOSABLE) ×3 IMPLANT
TUBING INSUFFLATION 10FT LAP (TUBING) IMPLANT
WATER STERILE IRR 1000ML POUR (IV SOLUTION) IMPLANT

## 2017-12-06 NOTE — Anesthesia Procedure Notes (Signed)
Procedure Name: Intubation Date/Time: 12/06/2017 11:34 AM Performed by: Anne Fu, CRNA Pre-anesthesia Checklist: Patient identified, Emergency Drugs available, Suction available, Patient being monitored and Timeout performed Patient Re-evaluated:Patient Re-evaluated prior to induction Oxygen Delivery Method: Circle system utilized Preoxygenation: Pre-oxygenation with 100% oxygen Induction Type: IV induction Ventilation: Mask ventilation without difficulty Laryngoscope Size: Mac and 4 Grade View: Grade I Tube type: Oral Tube size: 7.5 mm Number of attempts: 1 Airway Equipment and Method: Stylet Placement Confirmation: ETT inserted through vocal cords under direct vision,  positive ETCO2 and breath sounds checked- equal and bilateral Secured at: 23 cm Tube secured with: Tape Dental Injury: Teeth and Oropharynx as per pre-operative assessment

## 2017-12-06 NOTE — Op Note (Signed)
Preoperative diagnosis: Clinically localized adenocarcinoma of the prostate (clinical stage T1c Nx Mx)  Postoperative diagnosis: Clinically localized adenocarcinoma of the prostate (clinical stage T1c Nx Mx)  Procedure:  1. Robotic assisted laparoscopic radical prostatectomy (right nerve sparing) 2. Bilateral robotic assisted laparoscopic pelvic lymphadenectomy  Surgeon: Pryor Curia. M.D.  Assistant(s): Debbrah Alar, PA-C  An assistant was required for this surgical procedure.  The duties of the assistant included but were not limited to suctioning, passing suture, camera manipulation, retraction. This procedure would not be able to be performed without an Environmental consultant.   Resident: Dr. Basilio Cairo  Anesthesia: General  Complications: None  EBL: 100 mL  IVF:  1800 mL crystalloid  Specimens: 1. Prostate and seminal vesicles 2. Right pelvic lymph nodes 3. Left pelvic lymph nodes  Disposition of specimens: Pathology  Drains: 1. 20 Fr coude catheter 2. # 19 Blake pelvic drain  Indication: Brett Trevino is a 62 y.o. patient with clinically localized prostate cancer.  After a thorough review of the management options for treatment of prostate cancer, he elected to proceed with surgical therapy and the above procedure(s).  We have discussed the potential benefits and risks of the procedure, side effects of the proposed treatment, the likelihood of the patient achieving the goals of the procedure, and any potential problems that might occur during the procedure or recuperation. Informed consent has been obtained.  Description of procedure:  The patient was taken to the operating room and a general anesthetic was administered. He was given preoperative antibiotics, placed in the dorsal lithotomy position, and prepped and draped in the usual sterile fashion. Next a preoperative timeout was performed. A urethral catheter was placed into the bladder and a site was selected near  the umbilicus for placement of the camera port. This was placed using a standard open Hassan technique which allowed entry into the peritoneal cavity under direct vision and without difficulty. An 8 mm port was placed and a pneumoperitoneum established. The camera was then used to inspect the abdomen and there was no evidence of any intra-abdominal injuries or other abnormalities. The remaining abdominal ports were then placed. 8 mm robotic ports were placed in the right lower quadrant, left lower quadrant, and far left lateral abdominal wall. A 5 mm port was placed in the right upper quadrant and a 12 mm port was placed in the right lateral abdominal wall for laparoscopic assistance. All ports were placed under direct vision without difficulty. The surgical cart was then docked.   Utilizing the cautery scissors, the bladder was reflected posteriorly allowing entry into the space of Retzius and identification of the endopelvic fascia and prostate. The periprostatic fat was then removed from the prostate allowing full exposure of the endopelvic fascia. The endopelvic fascia was then incised from the apex back to the base of the prostate bilaterally and the underlying levator muscle fibers were swept laterally off the prostate thereby isolating the dorsal venous complex. The dorsal vein was then stapled and divided with a 45 mm Flex Echelon stapler. Attention then turned to the bladder neck which was divided anteriorly thereby allowing entry into the bladder and exposure of the urethral catheter. The catheter balloon was deflated and the catheter was brought into the operative field and used to retract the prostate anteriorly. The posterior bladder neck was then examined and was divided allowing further dissection between the bladder and prostate posteriorly until the vasa deferentia and seminal vessels were identified. The vasa deferentia were isolated, divided, and  lifted anteriorly. The seminal vesicles were  dissected down to their tips with care to control the seminal vascular arterial blood supply. These structures were then lifted anteriorly and the space between Denonvillier's fascia and the anterior rectum was developed with a combination of sharp and blunt dissection. This isolated the vascular pedicles of the prostate.  The lateral prostatic fascia on the right side of the prostate was then sharply incised allowing release of the neurovascular bundle. The vascular pedicle of the prostate on the right side was then ligated with Weck clips between the prostate and neurovascular bundle and divided with sharp cold scissor dissection resulting in neurovascular bundle preservation. On the left side, a wide non nerve sparing dissection was performed with Weck clips used to ligate the vascular pedicle of the prostate. The neurovascular bundle on the right side was then separated off the apex of the prostate and urethra.  The urethra was then sharply transected allowing the prostate specimen to be disarticulated. The pelvis was copiously irrigated and hemostasis was ensured. There was no evidence for rectal injury.  Attention then turned to the right pelvic sidewall. The fibrofatty tissue between the external iliac vein, confluence of the iliac vessels, hypogastric artery, and Cooper's ligament was dissected free from the pelvic sidewall with care to preserve the obturator nerve. Weck clips were used for lymphostasis and hemostasis. An identical procedure was performed on the contralateral side and the lymphatic packets were removed for permanent pathologic analysis.  Attention then turned to the urethral anastomosis. A 2-0 Vicryl slip knot was placed between Denonvillier's fascia, the posterior bladder neck, and the posterior urethra to reapproximate these structures. A double-armed 3-0 Monocryl suture was then used to perform a 360 running tension-free anastomosis between the bladder neck and urethra. A new  urethral catheter was then placed into the bladder and irrigated. There were no blood clots within the bladder and the anastomosis appeared to be watertight. A #19 Blake drain was then brought through the left lateral 8 mm port site and positioned appropriately within the pelvis. It was secured to the skin with a nylon suture. The surgical cart was then undocked. The right lateral 12 mm port site was closed at the fascial level with a 0 Vicryl suture placed laparoscopically. All remaining ports were then removed under direct vision. The prostate specimen was removed intact within the Endopouch retrieval bag via the periumbilical camera port site. This fascial opening was closed with two running 0 Vicryl sutures. 0.25% Marcaine was then injected into all port sites and all incisions were reapproximated at the skin level with 4-0 Monocryl subcuticular sutures. Dermabond was applied. The patient appeared to tolerate the procedure well and without complications. The patient was able to be extubated and transferred to the recovery unit in satisfactory condition.   Pryor Curia MD

## 2017-12-06 NOTE — Progress Notes (Signed)
Patient ID: Brett Trevino, male   DOB: Aug 30, 1955, 62 y.o.   MRN: [id number]  Post-op note  Subjective: The patient is doing well.  No complaints.  Objective: Vital signs in last 24 hours: Temp:  [97.6 F (36.4 C)-98.7 F (37.1 C)] 97.6 F (36.4 C) (09/05 1432) Pulse Rate:  [59-86] 84 (09/05 1600) Resp:  [6-18] 13 (09/05 1600) BP: (135-151)/(66-91) 151/75 (09/05 1530) SpO2:  [95 %-100 %] 100 % (09/05 1600) Weight:  [110.7 kg] 110.7 kg (09/05 0947)  Intake/Output from previous day: No intake/output data recorded. Intake/Output this shift: Total I/O In: 3300 [I.V.:2200; IV Piggyback:1100] Out: 950 [Urine:800; Drains:50; Blood:100]  Physical Exam:  General: Alert and oriented. Abdomen: Soft, Nondistended. Incisions: Clean and dry.  Lab Results: Recent Labs    12/06/17 1458  HGB 15.1  HCT 43.8    Assessment/Plan: POD#0   1) Continue to monitor, ambulate, IS   Brett Trevino. MD   LOS: 0 days   Brett Trevino,Brett Trevino 12/06/2017, 5:17 PM

## 2017-12-06 NOTE — Anesthesia Postprocedure Evaluation (Signed)
Anesthesia Post Note  Patient: Brett Trevino  Procedure(s) Performed: XI ROBOTIC ASSISTED LAPAROSCOPIC RADICAL PROSTATECTOMY LEVEL 2 (N/A ) LYMPHADENECTOMY, PELVIC (Bilateral )     Patient location during evaluation: PACU Anesthesia Type: General Level of consciousness: awake and alert Pain management: pain level controlled Vital Signs Assessment: post-procedure vital signs reviewed and stable Respiratory status: spontaneous breathing, nonlabored ventilation, respiratory function stable and patient connected to nasal cannula oxygen Cardiovascular status: blood pressure returned to baseline and stable Postop Assessment: no apparent nausea or vomiting Anesthetic complications: no    Last Vitals:  Vitals:   12/06/17 1524 12/06/17 1530  BP:  (!) 151/75  Pulse: 72 82  Resp: 17 13  Temp:    SpO2: 99% 97%    Last Pain:  Vitals:   12/06/17 1530  TempSrc:   PainSc: 4                  [name] S

## 2017-12-06 NOTE — Discharge Instructions (Signed)

## 2017-12-06 NOTE — Progress Notes (Signed)
Assumed care of patient from Estell Harpin, RN

## 2017-12-06 NOTE — Transfer of Care (Signed)
Immediate Anesthesia Transfer of Care Note  Patient: Brett Trevino  Procedure(s) Performed: Procedure(s): XI ROBOTIC ASSISTED LAPAROSCOPIC RADICAL PROSTATECTOMY LEVEL 2 (N/A) LYMPHADENECTOMY, PELVIC (Bilateral)  Patient Location: PACU  Anesthesia Type:General  Level of Consciousness:  sedated, patient cooperative and responds to stimulation  Airway & Oxygen Therapy:Patient Spontanous Breathing and Patient connected to face mask oxgen  Post-op Assessment:  Report given to PACU RN and Post -op Vital signs reviewed and stable  Post vital signs:  Reviewed and stable  Last Vitals:  Vitals:   12/06/17 0947  BP: 135/71  Pulse: (!) 59  Resp: 18  Temp: 37.1 C  SpO2: 29%    Complications: No apparent anesthesia complications

## 2017-12-06 NOTE — Anesthesia Preprocedure Evaluation (Signed)
Anesthesia Evaluation  Patient identified by MRN, date of birth, ID band Patient awake    Reviewed: Allergy & Precautions, H&P , NPO status , Patient's Chart, lab work & pertinent test results, reviewed documented beta blocker date and time   History of Anesthesia Complications (+) history of anesthetic complications  Airway Mallampati: II  TM Distance: >3 FB Neck ROM: full    Dental  (+) Teeth Intact, Dental Advisory Given   Pulmonary neg pulmonary ROS,    Pulmonary exam normal breath sounds clear to auscultation       Cardiovascular Exercise Tolerance: Good negative cardio ROS   Rhythm:regular Rate:Normal     Neuro/Psych negative neurological ROS  negative psych ROS   GI/Hepatic negative GI ROS, Neg liver ROS,   Endo/Other  negative endocrine ROSdiabetes, Type 2  Renal/GU negative Renal ROS  negative genitourinary   Musculoskeletal   Abdominal (+) + obese,   Peds  Hematology negative hematology ROS (+)   Anesthesia Other Findings   Reproductive/Obstetrics negative OB ROS                             Anesthesia Physical  Anesthesia Plan  ASA: III  Anesthesia Plan: General   Post-op Pain Management:    Induction: Intravenous  PONV Risk Score and Plan: 4 or greater and Ondansetron, Dexamethasone and Treatment may vary due to age or medical condition  Airway Management Planned:   Additional Equipment:   Intra-op Plan:   Post-operative Plan: Extubation in OR  Informed Consent: I have reviewed the patients History and Physical, chart, labs and discussed the procedure including the risks, benefits and alternatives for the proposed anesthesia with the patient or authorized representative who has indicated his/her understanding and acceptance.   Dental advisory given  Plan Discussed with: CRNA  Anesthesia Plan Comments:         Anesthesia Quick Evaluation

## 2017-12-07 ENCOUNTER — Encounter (HOSPITAL_COMMUNITY): Payer: Self-pay | Admitting: Urology

## 2017-12-07 DIAGNOSIS — C61 Malignant neoplasm of prostate: Secondary | ICD-10-CM | POA: Diagnosis not present

## 2017-12-07 DIAGNOSIS — E119 Type 2 diabetes mellitus without complications: Secondary | ICD-10-CM | POA: Diagnosis not present

## 2017-12-07 DIAGNOSIS — Z7984 Long term (current) use of oral hypoglycemic drugs: Secondary | ICD-10-CM | POA: Diagnosis not present

## 2017-12-07 DIAGNOSIS — Z79899 Other long term (current) drug therapy: Secondary | ICD-10-CM | POA: Diagnosis not present

## 2017-12-07 DIAGNOSIS — K219 Gastro-esophageal reflux disease without esophagitis: Secondary | ICD-10-CM | POA: Diagnosis not present

## 2017-12-07 DIAGNOSIS — Z7982 Long term (current) use of aspirin: Secondary | ICD-10-CM | POA: Diagnosis not present

## 2017-12-07 DIAGNOSIS — I10 Essential (primary) hypertension: Secondary | ICD-10-CM | POA: Diagnosis not present

## 2017-12-07 DIAGNOSIS — E7800 Pure hypercholesterolemia, unspecified: Secondary | ICD-10-CM | POA: Diagnosis not present

## 2017-12-07 LAB — GLUCOSE, CAPILLARY
GLUCOSE-CAPILLARY: 182 mg/dL — AB (ref 70–99)
GLUCOSE-CAPILLARY: 175 mg/dL — AB (ref 70–99)
GLUCOSE-CAPILLARY: 196 mg/dL — AB (ref 70–99)
Glucose-Capillary: 172 mg/dL — ABNORMAL HIGH (ref 70–99)
Glucose-Capillary: 214 mg/dL — ABNORMAL HIGH (ref 70–99)

## 2017-12-07 LAB — HEMOGLOBIN AND HEMATOCRIT, BLOOD
HCT: 40.9 % (ref 39.0–52.0)
Hemoglobin: 14.1 g/dL (ref 13.0–17.0)

## 2017-12-07 MED ORDER — BISACODYL 10 MG RE SUPP
10.0000 mg | Freq: Once | RECTAL | Status: AC
  Administered 2017-12-07: 10 mg via RECTAL
  Filled 2017-12-07: qty 1

## 2017-12-07 MED ORDER — TRAMADOL HCL 50 MG PO TABS: 50.0000 mg | ORAL_TABLET | Freq: Four times a day (QID) | ORAL | Status: DC | PRN

## 2017-12-07 NOTE — Progress Notes (Signed)
Patient ID: Brett Trevino, male   DOB: January 20, 1956, 62 y.o.   MRN: [id number]  1 Day Post-Op Subjective: The patient is doing well.  No nausea or vomiting. Pain is adequately controlled.  Objective: Vital signs in last 24 hours: Temp:  [97.6 F (36.4 C)-98.7 F (37.1 C)] 98.4 F (36.9 C) (09/06 0502) Pulse Rate:  [59-86] 75 (09/06 0502) Resp:  [6-20] 20 (09/06 0502) BP: (107-151)/(54-91) 107/54 (09/06 0502) SpO2:  [95 %-100 %] 95 % (09/06 0502) Weight:  [110.7 kg] 110.7 kg (09/05 0947)  Intake/Output from previous day: 09/05 0701 - 09/06 0700 In: 70786.7 [P.O.:36315; I.V.:3616.2; IV Piggyback:1200] Out: 2250 [Urine:2000; Drains:150; Blood:100] Intake/Output this shift: No intake/output data recorded.  Physical Exam:  General: Alert and oriented. CV: RRR Lungs: Clear bilaterally. GI: Soft, Nondistended. Incisions: Clean, dry, and intact Urine: Clear Extremities: Nontender, no erythema, no edema.  Lab Results: Recent Labs    12/06/17 1458 12/07/17 0410  HGB 15.1 14.1  HCT 43.8 40.9      Assessment/Plan: POD# 1 s/p robotic prostatectomy.  1) SL IVF 2) Ambulate, Incentive spirometry 3) Transition to oral pain medication 4) Dulcolax suppository 5) D/C pelvic drain 6) Plan for likely discharge later today   Pryor Curia. MD   LOS: 0 days   Chasyn Cinque,LES 12/07/2017, 7:57 AM

## 2017-12-07 NOTE — Progress Notes (Signed)
Urology Progress Note   1 Day Post-Op s/p RALP  Subjective: NAEON. Pain controlled on tylenol only, tolerating clears without N/V Ambulated this morning uop @ 2.0L yestreday, 900cc overnight JP 150cc serosang Hgb 14.1 this am  Objective: Vital signs in last 24 hours: Temp:  [97.6 F (36.4 C)-98.7 F (37.1 C)] 98.4 F (36.9 C) (09/06 0502) Pulse Rate:  [59-86] 75 (09/06 0502) Resp:  [6-20] 20 (09/06 0502) BP: (107-151)/(54-91) 107/54 (09/06 0502) SpO2:  [95 %-100 %] 95 % (09/06 0502) Weight:  [110.7 kg] 110.7 kg (09/05 0947)  Intake/Output from previous day: 09/05 0701 - 09/06 0700 In: 43276.1 [P.O.:36315; I.V.:3616.2; IV Piggyback:1200] Out: 2250 [Urine:2000; Drains:150; Blood:100] Intake/Output this shift: Total I/O In: 1320.1 [I.V.:1220.1; IV Piggyback:100] Out: 1000 [Urine:900; Drains:100]  Physical Exam:  General: Alert and oriented CV: RRR Lungs: Clear Abdomen: Soft, appropriately tender. Port incisions and extraction site at midline clean dry and intaact with surgical glue. JP serosang GU: Foley in place draining pink tinged amber urine  Ext: NT, No erythema  Lab Results: Recent Labs    12/06/17 1458 12/07/17 0410  HGB 15.1 14.1  HCT 43.8 40.9   BMET No results for input(s): NA, K, CL, CO2, GLUCOSE, BUN, CREATININE, CALCIUM in the last 72 hours.   Studies/Results: No results found.  Assessment/Plan:  62 y.o. male now POD1 s/p RALP.  Overall doing well post-op.   - medlock, clear diet - OOB, ambulate, IS - plan to DC JP drain later - needs foley catheter education, will DC with in place - anticipate possible dc to home later today   Dispo: anticipate DC to home, no HH needs. Will need foley teaching prior to DC   LOS: 0 days   Fredricka Bonine 12/07/2017, 7:00 AM

## 2017-12-07 NOTE — Discharge Summary (Signed)
  Date of admission: 12/06/2017  Date of discharge: 12/07/2017  Admission diagnosis: Prostate Cancer  Discharge diagnosis: Prostate Cancer  History and Physical: For full details, please see admission history and physical. Briefly, Brett Trevino is a 62 y.o. gentleman with localized prostate cancer.  After discussing management/treatment options, he elected to proceed with surgical treatment.  Hospital Course: Brett Trevino was taken to the operating room on 12/06/2017 and underwent a robotic assisted laparoscopic radical prostatectomy. He tolerated this procedure well and without complications. Postoperatively, he was able to be transferred to a regular hospital room following recovery from anesthesia.  He was able to begin ambulating the night of surgery. He remained hemodynamically stable overnight.  He had excellent urine output with appropriately minimal output from his pelvic drain and his pelvic drain was removed on POD #1.  He was transitioned to oral pain medication, tolerated a clear liquid diet, and had met all discharge criteria and was able to be discharged home later on POD#1.  Laboratory values:  Recent Labs    12/06/17 1458 12/07/17 0410  HGB 15.1 14.1  HCT 43.8 40.9    Disposition: Home  Discharge instruction: He was instructed to be ambulatory but to refrain from heavy lifting, strenuous activity, or driving. He was instructed on urethral catheter care.  Discharge medications:   Allergies as of 12/07/2017   No Known Allergies     Medication List    STOP taking these medications   aspirin EC 81 MG tablet     TAKE these medications   atorvastatin 10 MG tablet Commonly known as:  LIPITOR Take 10 mg by mouth every evening.   metFORMIN 500 MG tablet Commonly known as:  GLUCOPHAGE Take 500 mg by mouth 2 (two) times daily with a meal.   sulfamethoxazole-trimethoprim 800-160 MG tablet Commonly known as:  BACTRIM DS,SEPTRA DS Take 1 tablet by mouth 2 (two) times  daily. Start the day prior to foley removal appointment   traMADol 50 MG tablet Commonly known as:  ULTRAM Take 1-2 tablets (50-100 mg total) by mouth every 6 (six) hours as needed for moderate pain or severe pain.       Followup: He will followup in 1 week for catheter removal and to discuss his surgical pathology results.

## 2017-12-07 NOTE — Plan of Care (Signed)
Pt ambulated 2X this shift and  tolerated well. Pt tolerating CL diet. Pt at ease during shift and reported feeling better after ambulation.

## 2017-12-19 ENCOUNTER — Other Ambulatory Visit: Payer: Self-pay

## 2017-12-19 ENCOUNTER — Encounter (HOSPITAL_COMMUNITY): Payer: Self-pay

## 2017-12-19 ENCOUNTER — Inpatient Hospital Stay (HOSPITAL_COMMUNITY)
Admission: EM | Admit: 2017-12-19 | Discharge: 2017-12-23 | DRG: 386 | Disposition: A | Discharge status: Home / Self Care | Payer: BLUE CROSS/BLUE SHIELD | Attending: Internal Medicine | Admitting: Internal Medicine

## 2017-12-19 ENCOUNTER — Inpatient Hospital Stay (HOSPITAL_COMMUNITY): Payer: BLUE CROSS/BLUE SHIELD

## 2017-12-19 ENCOUNTER — Emergency Department (HOSPITAL_COMMUNITY): Payer: BLUE CROSS/BLUE SHIELD

## 2017-12-19 DIAGNOSIS — C61 Malignant neoplasm of prostate: Secondary | ICD-10-CM | POA: Diagnosis not present

## 2017-12-19 DIAGNOSIS — Z79899 Other long term (current) drug therapy: Secondary | ICD-10-CM | POA: Diagnosis not present

## 2017-12-19 DIAGNOSIS — E119 Type 2 diabetes mellitus without complications: Secondary | ICD-10-CM | POA: Diagnosis not present

## 2017-12-19 DIAGNOSIS — R109 Unspecified abdominal pain: Secondary | ICD-10-CM | POA: Diagnosis not present

## 2017-12-19 DIAGNOSIS — Z23 Encounter for immunization: Secondary | ICD-10-CM | POA: Diagnosis not present

## 2017-12-19 DIAGNOSIS — Z9079 Acquired absence of other genital organ(s): Secondary | ICD-10-CM

## 2017-12-19 DIAGNOSIS — E7800 Pure hypercholesterolemia, unspecified: Secondary | ICD-10-CM | POA: Diagnosis not present

## 2017-12-19 DIAGNOSIS — K56600 Partial intestinal obstruction, unspecified as to cause: Secondary | ICD-10-CM | POA: Diagnosis not present

## 2017-12-19 DIAGNOSIS — E1169 Type 2 diabetes mellitus with other specified complication: Secondary | ICD-10-CM | POA: Diagnosis present

## 2017-12-19 DIAGNOSIS — Z7984 Long term (current) use of oral hypoglycemic drugs: Secondary | ICD-10-CM | POA: Diagnosis not present

## 2017-12-19 DIAGNOSIS — K5730 Diverticulosis of large intestine without perforation or abscess without bleeding: Secondary | ICD-10-CM | POA: Diagnosis not present

## 2017-12-19 DIAGNOSIS — K6389 Other specified diseases of intestine: Secondary | ICD-10-CM | POA: Diagnosis not present

## 2017-12-19 DIAGNOSIS — E785 Hyperlipidemia, unspecified: Secondary | ICD-10-CM

## 2017-12-19 DIAGNOSIS — Z8546 Personal history of malignant neoplasm of prostate: Secondary | ICD-10-CM | POA: Diagnosis not present

## 2017-12-19 DIAGNOSIS — K6289 Other specified diseases of anus and rectum: Secondary | ICD-10-CM | POA: Diagnosis not present

## 2017-12-19 DIAGNOSIS — K5669 Other partial intestinal obstruction: Secondary | ICD-10-CM | POA: Diagnosis not present

## 2017-12-19 DIAGNOSIS — R112 Nausea with vomiting, unspecified: Secondary | ICD-10-CM | POA: Diagnosis not present

## 2017-12-19 DIAGNOSIS — R1110 Vomiting, unspecified: Secondary | ICD-10-CM | POA: Diagnosis not present

## 2017-12-19 DIAGNOSIS — K56609 Unspecified intestinal obstruction, unspecified as to partial versus complete obstruction: Secondary | ICD-10-CM | POA: Diagnosis present

## 2017-12-19 DIAGNOSIS — K50012 Crohn's disease of small intestine with intestinal obstruction: Principal | ICD-10-CM

## 2017-12-19 DIAGNOSIS — K529 Noninfective gastroenteritis and colitis, unspecified: Secondary | ICD-10-CM | POA: Diagnosis not present

## 2017-12-19 DIAGNOSIS — Z4682 Encounter for fitting and adjustment of non-vascular catheter: Secondary | ICD-10-CM | POA: Diagnosis not present

## 2017-12-19 DIAGNOSIS — J9811 Atelectasis: Secondary | ICD-10-CM | POA: Diagnosis not present

## 2017-12-19 DIAGNOSIS — R140 Abdominal distension (gaseous): Secondary | ICD-10-CM | POA: Diagnosis not present

## 2017-12-19 HISTORY — DX: Malignant neoplasm of prostate: C61

## 2017-12-19 LAB — CBC
HCT: 45.9 % (ref 39.0–52.0)
HEMATOCRIT: 47.3 % (ref 39.0–52.0)
HEMOGLOBIN: 15.8 g/dL (ref 13.0–17.0)
Hemoglobin: 16.7 g/dL (ref 13.0–17.0)
MCH: 32.2 pg (ref 26.0–34.0)
MCH: 32.2 pg (ref 26.0–34.0)
MCHC: 35.3 g/dL (ref 30.0–36.0)
MCHC: 34.4 g/dL (ref 30.0–36.0)
MCV: 91.1 fL (ref 78.0–100.0)
MCV: 93.7 fL (ref 78.0–100.0)
PLATELETS: 280 10*3/uL (ref 150–400)
Platelets: 288 10*3/uL (ref 150–400)
RBC: 5.19 MIL/uL (ref 4.22–5.81)
RBC: 4.9 MIL/uL (ref 4.22–5.81)
RDW: 13.6 % (ref 11.5–15.5)
RDW: 14 % (ref 11.5–15.5)
WBC: 20.3 10*3/uL — AB (ref 4.0–10.5)
WBC: 15.7 10*3/uL — ABNORMAL HIGH (ref 4.0–10.5)

## 2017-12-19 LAB — URINALYSIS, ROUTINE W REFLEX MICROSCOPIC
Bilirubin Urine: NEGATIVE
GLUCOSE, UA: NEGATIVE mg/dL
Hgb urine dipstick: NEGATIVE
Ketones, ur: 20 mg/dL — AB
LEUKOCYTES UA: NEGATIVE
NITRITE: NEGATIVE
PROTEIN: NEGATIVE mg/dL
Specific Gravity, Urine: 1.042 — ABNORMAL HIGH (ref 1.005–1.030)
pH: 6 (ref 5.0–8.0)

## 2017-12-19 LAB — COMPREHENSIVE METABOLIC PANEL
ALT: 22 U/L (ref 0–44)
AST: 25 U/L (ref 15–41)
Albumin: 4 g/dL (ref 3.5–5.0)
Alkaline Phosphatase: 77 U/L (ref 38–126)
Anion gap: 13 (ref 5–15)
BILIRUBIN TOTAL: 1.4 mg/dL — AB (ref 0.3–1.2)
BUN: 9 mg/dL (ref 8–23)
CO2: 23 mmol/L (ref 22–32)
Calcium: 9.4 mg/dL (ref 8.9–10.3)
Chloride: 101 mmol/L (ref 98–111)
Creatinine, Ser: 0.89 mg/dL (ref 0.61–1.24)
Glucose, Bld: 167 mg/dL — ABNORMAL HIGH (ref 70–99)
Potassium: 3.9 mmol/L (ref 3.5–5.1)
Sodium: 137 mmol/L (ref 135–145)
TOTAL PROTEIN: 8 g/dL (ref 6.5–8.1)

## 2017-12-19 LAB — CBG MONITORING, ED: Glucose-Capillary: 156 mg/dL — ABNORMAL HIGH (ref 70–99)

## 2017-12-19 LAB — CREATININE, SERUM
CREATININE: 0.89 mg/dL (ref 0.61–1.24)
GFR calc Af Amer: >60 mL/min (ref >60)

## 2017-12-19 LAB — GLUCOSE, CAPILLARY
GLUCOSE-CAPILLARY: 139 mg/dL — AB (ref 70–99)
GLUCOSE-CAPILLARY: 143 mg/dL — AB (ref 70–99)

## 2017-12-19 LAB — LIPASE, BLOOD: Lipase: 34 U/L (ref 11–51)

## 2017-12-19 MED ORDER — ONDANSETRON HCL 4 MG/2ML IJ SOLN
4.0000 mg | Freq: Once | INTRAMUSCULAR | Status: AC
  Administered 2017-12-19: 4 mg via INTRAVENOUS
  Filled 2017-12-19: qty 2

## 2017-12-19 MED ORDER — INSULIN ASPART 100 UNIT/ML ~~LOC~~ SOLN
0.0000 [IU] | SUBCUTANEOUS | Status: DC
  Administered 2017-12-19 – 2017-12-20 (×3): 1 [IU] via SUBCUTANEOUS
  Administered 2017-12-20 – 2017-12-21 (×5): 2 [IU] via SUBCUTANEOUS
  Administered 2017-12-21: 3 [IU] via SUBCUTANEOUS
  Administered 2017-12-21: 1 [IU] via SUBCUTANEOUS
  Administered 2017-12-22 (×3): 2 [IU] via SUBCUTANEOUS
  Administered 2017-12-22: 1 [IU] via SUBCUTANEOUS
  Administered 2017-12-23: 2 [IU] via SUBCUTANEOUS

## 2017-12-19 MED ORDER — IOPAMIDOL (ISOVUE-300) INJECTION 61%: 30.0000 mL | Freq: Once | INTRAVENOUS | Status: DC | PRN

## 2017-12-19 MED ORDER — ACETAMINOPHEN 325 MG PO TABS: 650.0000 mg | ORAL_TABLET | Freq: Four times a day (QID) | ORAL | Status: DC | PRN

## 2017-12-19 MED ORDER — FENTANYL CITRATE (PF) 100 MCG/2ML IJ SOLN: 50.0000 ug | INTRAMUSCULAR | Status: DC | PRN

## 2017-12-19 MED ORDER — MORPHINE SULFATE (PF) 2 MG/ML IV SOLN: 2.0000 mg | INTRAVENOUS | Status: DC | PRN

## 2017-12-19 MED ORDER — IOPAMIDOL (ISOVUE-300) INJECTION 61%: 100.0000 mL | Freq: Once | INTRAVENOUS | Status: DC | PRN

## 2017-12-19 MED ORDER — FENTANYL CITRATE (PF) 100 MCG/2ML IJ SOLN
50.0000 ug | Freq: Once | INTRAMUSCULAR | Status: AC
  Administered 2017-12-19: 50 ug via INTRAVENOUS
  Filled 2017-12-19: qty 2

## 2017-12-19 MED ORDER — CIPROFLOXACIN IN D5W 400 MG/200ML IV SOLN
400.0000 mg | Freq: Two times a day (BID) | INTRAVENOUS | Status: DC
  Administered 2017-12-19 – 2017-12-21 (×4): 400 mg via INTRAVENOUS
  Filled 2017-12-19 (×4): qty 200

## 2017-12-19 MED ORDER — IOPAMIDOL (ISOVUE-300) INJECTION 61%
100.0000 mL | Freq: Once | INTRAVENOUS | Status: AC | PRN
  Administered 2017-12-19: 100 mL via INTRAVENOUS

## 2017-12-19 MED ORDER — METRONIDAZOLE IN NACL 5-0.79 MG/ML-% IV SOLN
500.0000 mg | Freq: Three times a day (TID) | INTRAVENOUS | Status: DC
  Administered 2017-12-19 – 2017-12-21 (×5): 500 mg via INTRAVENOUS
  Filled 2017-12-19 (×5): qty 100

## 2017-12-19 MED ORDER — ONDANSETRON HCL 4 MG PO TABS: 4.0000 mg | ORAL_TABLET | Freq: Four times a day (QID) | ORAL | Status: DC | PRN

## 2017-12-19 MED ORDER — ACETAMINOPHEN 650 MG RE SUPP
650.0000 mg | Freq: Four times a day (QID) | RECTAL | Status: DC | PRN
  Administered 2017-12-19: 650 mg via RECTAL
  Filled 2017-12-19: qty 1

## 2017-12-19 MED ORDER — IOPAMIDOL (ISOVUE-300) INJECTION 61%
30.0000 mL | Freq: Once | INTRAVENOUS | Status: AC | PRN
  Administered 2017-12-19: 30 mL via ORAL

## 2017-12-19 MED ORDER — ENOXAPARIN SODIUM 40 MG/0.4ML ~~LOC~~ SOLN
40.0000 mg | SUBCUTANEOUS | Status: DC
  Administered 2017-12-19 – 2017-12-21 (×3): 40 mg via SUBCUTANEOUS
  Filled 2017-12-19 (×3): qty 0.4

## 2017-12-19 MED ORDER — ONDANSETRON HCL 4 MG/2ML IJ SOLN: 4.0000 mg | Freq: Four times a day (QID) | INTRAMUSCULAR | Status: DC | PRN

## 2017-12-19 MED ORDER — METHYLPREDNISOLONE SODIUM SUCC 125 MG IJ SOLR
60.0000 mg | Freq: Two times a day (BID) | INTRAMUSCULAR | Status: DC
  Administered 2017-12-19 – 2017-12-20 (×2): 60 mg via INTRAVENOUS
  Filled 2017-12-19 (×2): qty 2

## 2017-12-19 MED ORDER — SODIUM CHLORIDE 0.9 % IV BOLUS
1000.0000 mL | Freq: Once | INTRAVENOUS | Status: AC
  Administered 2017-12-19: 1000 mL via INTRAVENOUS

## 2017-12-19 NOTE — ED Triage Notes (Signed)
Pt reports vomiting multiple times since last night and abd pain. Denies diarrhea.  LBM was this morning.  Pt had prostate removed Sept 5, denies any complications.  Reports was admitted in May at [facility] for intestinal infection.

## 2017-12-19 NOTE — ED Notes (Signed)
Radiology at bedside

## 2017-12-19 NOTE — ED Provider Notes (Addendum)
[facility] EMERGENCY DEPARTMENT Provider Note   CSN: [id number] Arrival date & time: 12/19/17  1011     History   Chief Complaint Chief Complaint  Patient presents with  . Abdominal Pain  . Emesis    HPI Cordarrel Stiefel is a 62 y.o. male.  Abdominal pain and bloating since last night with associated vomiting.  Status post 12/06/2017 laparoscopic robotic prostatectomy secondary to prostate cancer.  Past medical history includes Crohn's disease, diabetes, hypercholesterolemia.  Severity of symptoms is moderate to severe.  Palpation makes pain worse.     Past Medical History:  Diagnosis Date  . Cancer [facility])    Prostate  . Complication of anesthesia    difficulty waking up after anesthesia  . Crohn disease ([place]) 08/28/2017   pt denies  . Diabetes ([place])   . High cholesterol     Patient Active Problem List   Diagnosis Date Noted  . SBO (small bowel obstruction) ([place]) 12/19/2017  . Vomiting 12/19/2017  . Type 2 diabetes mellitus with hyperlipidemia ([place]) 12/19/2017  . Prostate cancer ([place]) 12/06/2017  . Crohn disease ([place]) 08/28/2017    Past Surgical History:  Procedure Laterality Date  . COLONOSCOPY  2013  . LYMPHADENECTOMY Bilateral 12/06/2017   Procedure: LYMPHADENECTOMY, PELVIC;  Surgeon: Raynelle Bring, MD;  Location: [facility];  Service: Urology;  Laterality: Bilateral;  . PROSTATE BIOPSY N/A 09/28/2017   Procedure: BIOPSY TRANSRECTAL ULTRASONIC PROSTATE (TUBP);  Surgeon: Irine Seal, MD;  Location: [facility];  Service: Urology;  Laterality: N/A;  . PROSTATECTOMY    . ROBOT ASSISTED LAPAROSCOPIC RADICAL PROSTATECTOMY N/A 12/06/2017   Procedure: XI ROBOTIC ASSISTED LAPAROSCOPIC RADICAL PROSTATECTOMY LEVEL 2;  Surgeon: Raynelle Bring, MD;  Location: [facility];  Service: Urology;  Laterality: N/A;  . TOOTH EXTRACTION  1999        Home Medications    Prior to Admission medications   Medication Sig Start Date End Date Taking? Authorizing Provider  atorvastatin (LIPITOR)  10 MG tablet Take 10 mg by mouth every evening.     [provider]  metFORMIN (GLUCOPHAGE) 500 MG tablet Take 500 mg by mouth 2 (two) times daily with a meal.     [provider]  sulfamethoxazole-trimethoprim (BACTRIM DS,SEPTRA DS) 800-160 MG tablet Take 1 tablet by mouth 2 (two) times daily. Start the day prior to foley removal appointment 12/06/17   Debbrah Alar, PA-C  traMADol (ULTRAM) 50 MG tablet Take 1-2 tablets (50-100 mg total) by mouth every 6 (six) hours as needed for moderate pain or severe pain. 12/06/17   Debbrah Alar, PA-C    Family History Family History  Problem Relation Age of Onset  . Hemochromatosis Sister     Social History Social History   Tobacco Use  . Smoking status: Never Smoker  . Smokeless tobacco: Never Used  Substance Use Topics  . Alcohol use: Never    Frequency: Never  . Drug use: Never     Allergies   Patient has no known allergies.   Review of Systems Review of Systems  All other systems reviewed and are negative.    Physical Exam Updated Vital Signs BP (!) 147/89   Pulse 74   Temp 98 F (36.7 C) (Oral)   Resp 20   Ht 5\' 7"  (1.702 m)   Wt 106.6 kg   SpO2 96%   BMI 36.81 kg/m   Physical Exam  Constitutional: He is oriented to person, place, and time. He appears well-developed and well-nourished.  HENT:  Head: Normocephalic and atraumatic.  Eyes: Conjunctivae are normal.  Neck: Neck supple.  Cardiovascular: Normal rate and regular rhythm.  Pulmonary/Chest: Effort normal and breath sounds normal.  Abdominal: Soft. Bowel sounds are normal.  Generalized bloating and tenderness  Musculoskeletal: Normal range of motion.  Neurological: He is alert and oriented to person, place, and time.  Skin: Skin is warm and dry.  Psychiatric: He has a normal mood and affect. His behavior is normal.  Nursing note and vitals reviewed.    ED Treatments / Results  Labs (all labs ordered are listed, but only abnormal results  are displayed) Labs Reviewed  COMPREHENSIVE METABOLIC PANEL - Abnormal; Notable for the following components:      Result Value   Glucose, Bld 167 (*)    Total Bilirubin 1.4 (*)    All other components within normal limits  CBC - Abnormal; Notable for the following components:   WBC 20.3 (*)    All other components within normal limits  CBG MONITORING, ED - Abnormal; Notable for the following components:   Glucose-Capillary 156 (*)    All other components within normal limits  LIPASE, BLOOD  URINALYSIS, ROUTINE W REFLEX MICROSCOPIC    EKG None  Radiology Ct Abdomen Pelvis W Contrast  Result Date: 12/19/2017 CLINICAL DATA:  Vomiting and abdominal pain. EXAM: CT ABDOMEN AND PELVIS WITH CONTRAST TECHNIQUE: Multidetector CT imaging of the abdomen and pelvis was performed using the standard protocol following bolus administration of intravenous contrast. CONTRAST:  <See Chart> ISOVUE-300 IOPAMIDOL (ISOVUE-300) INJECTION 61%, 56mL ISOVUE-300 IOPAMIDOL (ISOVUE-300) INJECTION 61%, 15mL ISOVUE-300 IOPAMIDOL (ISOVUE-300) INJECTION 61% COMPARISON:  09/20/2017. FINDINGS: Lower chest: Lung bases show no acute findings. Heart is at the upper limits of normal in size. No pericardial or pleural effusion. Prepericardiac lymph nodes are subcentimeter in short axis size. Distal esophagus is dilated and fluid-filled. Hepatobiliary: Liver is slightly decreased in attenuation diffusely. Liver and gallbladder are otherwise unremarkable. No biliary ductal dilatation. Pancreas: Negative. Spleen: Negative. Adrenals/Urinary Tract: Adrenal glands are unremarkable. Tiny stone in the right kidney. Kidneys are otherwise unremarkable. Ureters are decompressed. Bladder is low in volume. Stomach/Bowel: Stomach is dilated and fluid-filled. Duodenum and proximal jejunum are decompressed. There is dilated and fluid-filled distal jejunum and ileum, to the level of an apparent small bowel stricture in the right lower quadrant  (series 2, image 60), measuring approximately 6.7 cm. Remainder of the small bowel is decompressed. Appendix and colon are unremarkable. Vascular/Lymphatic: Atherosclerotic calcification of the arterial vasculature without abdominal aortic aneurysm. Retroperitoneal lymph nodes are not enlarged by CT size criteria. Circumaortic left renal vein. Reproductive: Prostatectomy. Other: Mild presacral edema. Trace interloop fluid. Mesenteries and peritoneum are otherwise unremarkable. No free air. Musculoskeletal: Degenerative changes in the spine. No worrisome lytic or sclerotic lesions. IMPRESSION: 1. 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 2. Hepatic steatosis. 3. Tiny right renal stone. 4.  Aortic atherosclerosis (ICD10-170.0). Electronically Signed   By: Lorin Picket M.D.   On: 12/19/2017 14:18    Procedures Procedures (including critical care time)  Medications Ordered in ED Medications  ondansetron (ZOFRAN) injection 4 mg (4 mg Intravenous Given 12/19/17 1144)  sodium chloride 0.9 % bolus 1,000 mL (0 mLs Intravenous Stopped 12/19/17 1245)  fentaNYL (SUBLIMAZE) injection 50 mcg (50 mcg Intravenous Given 12/19/17 1144)  iopamidol (ISOVUE-300) 61 % injection 30 mL (30 mLs Oral Contrast Given 12/19/17 1208)  iopamidol (ISOVUE-300) 61 % injection 100 mL (100 mLs Intravenous Contrast Given  12/19/17 1348)     Initial Impression / Assessment and Plan / ED Course  I have reviewed the triage vital signs and the nursing notes.  Pertinent labs & imaging results that were available during my care of the patient were reviewed by me and considered in my medical decision making (see chart for details).     Patient is status post robotic laparoscopic prostatectomy on 12/06/2017.  He now presents with abdominal pain and bloating with vomiting.  CT reveals a small bowel obstruction.  Discussed with general medicine, general surgery, general urology.   Patient will be admitted to [facility] hospital (Dr. Alyson Ingles)   [place] Performed by: Nat Christen Total critical care time: 40 minutes Critical care time was exclusive of separately billable procedures and treating other patients. Critical care was necessary to treat or prevent imminent or life-threatening deterioration. Critical care was time spent personally by me on the following activities: development of treatment plan with patient and/or surrogate as well as nursing, discussions with consultants, evaluation of patient's response to treatment, examination of patient, obtaining history from patient or surrogate, ordering and performing treatments and interventions, ordering and review of laboratory studies, ordering and review of radiographic studies, pulse oximetry and re-evaluation of patient's condition. Final Clinical Impressions(s) / ED Diagnoses   Final diagnoses:  SBO (small bowel obstruction) [facility])    ED Discharge Orders    None       Nat Christen, MD 12/19/17 1613    Nat Christen, MD 12/19/17 [id number]

## 2017-12-19 NOTE — ED Notes (Signed)
Patient transported to CT 

## 2017-12-19 NOTE — ED Notes (Signed)
ED TO INPATIENT HANDOFF REPORT  Name/Age/Gender Brett Trevino 62 y.o. male  Code Status Code Status History    Date Active Date Inactive Code Status Order ID Comments User Context   12/06/2017 1637 12/07/2017 1747 Full Code [id number]  Raynelle Bring, MD Inpatient    Advance Directive Documentation     Most Recent Value  Type of Advance Directive  Healthcare Power of Attorney, Living will  Pre-existing out of facility DNR order (yellow form or pink MOST form)  -  "MOST" Form in Place?  -      Home/SNF/Other Home  Chief Complaint Emesis  Level of Care/Admitting Diagnosis ED Disposition    ED Disposition Condition [name] [[id number]]  Level of Care: Med-Surg [16]  Diagnosis: SBO (small bowel obstruction) [facility]) [[id number]]  Admitting Physician: [place], [place]  Attending Physician: Kathie Dike [[id number]]  Estimated length of stay: past midnight tomorrow  Certification:: I certify this patient will need inpatient services for at least 2 midnights  PT Class (Do Not Modify): Inpatient [101]  PT Acc Code (Do Not Modify): Private [1]       Medical History Past Medical History:  Diagnosis Date  . Cancer [facility])    Prostate  . Complication of anesthesia    difficulty waking up after anesthesia  . Crohn disease ([place]) 08/28/2017   pt denies  . Diabetes ([place])   . High cholesterol     Allergies No Known Allergies  IV Location/Drains/Wounds Patient Lines/Drains/Airways Status   Active Line/Drains/Airways    Name:   Placement date:   Placement time:   Site:   Days:   Peripheral IV 12/06/17 Right Hand   12/06/17    0951    Hand   13   Peripheral IV 12/19/17 Right;Anterior Forearm   12/19/17    1123    Forearm   less than 1   NG/OG Tube Nasogastric 16 Fr. Right nare Xray;Aucultation Measured external length of tube 50 cm   12/19/17    1632    Right nare   less than 1   Urethral Catheter A. Henry, ST Latex;Coude 18 Fr.    12/06/17    1343    Latex;Coude   13   Incision (Closed) 09/28/17 Rectum   09/28/17    1237     82   Incision - 6 Ports Abdomen 1: Lateral;Lower;Left 2: Lateral;Left;Upper 3: Umbilicus;Superior 4: Right;Lateral;Lower 5: Right;Medial;Upper 6: Right;Lateral;Upper   12/06/17    1200     13          Labs/Imaging Results for orders placed or performed during the hospital encounter of 12/19/17 (from the past 48 hour(s))  POC CBG, ED     Status: Abnormal   Collection Time: 12/19/17 11:01 AM  Result Value Ref Range   Glucose-Capillary 156 (H) 70 - 99 mg/dL  Lipase, blood     Status: None   Collection Time: 12/19/17 11:26 AM  Result Value Ref Range   Lipase 34 11 - 51 U/L    Comment: Performed at [facility], [address] [place], [place] [address]  Comprehensive metabolic panel     Status: Abnormal   Collection Time: 12/19/17 11:26 AM  Result Value Ref Range   Sodium 137 135 - 145 mmol/L   Potassium 3.9 3.5 - 5.1 mmol/L   Chloride 101 98 - 111 mmol/L   CO2 23 22 - 32 mmol/L   Glucose, Bld 167 (H) 70 -  99 mg/dL   BUN 9 8 - 23 mg/dL   Creatinine, Ser 0.89 0.61 - 1.24 mg/dL   Calcium 9.4 8.9 - 10.3 mg/dL   Total Protein 8.0 6.5 - 8.1 g/dL   Albumin 4.0 3.5 - 5.0 g/dL   AST 25 15 - 41 U/L   ALT 22 0 - 44 U/L   Alkaline Phosphatase 77 38 - 126 U/L   Total Bilirubin 1.4 (H) 0.3 - 1.2 mg/dL   GFR calc non Af Amer >60 >60 mL/min   GFR calc Af Amer >60 >60 mL/min    Comment: (NOTE) The eGFR has been calculated using the CKD EPI equation. This calculation has not been validated in all clinical situations. eGFR's persistently <60 mL/min signify possible Chronic Kidney Disease.    Anion gap 13 5 - 15    Comment: Performed at [facility], [address] [place], [place] [address]  CBC     Status: Abnormal   Collection Time: 12/19/17 11:26 AM  Result Value Ref Range   WBC 20.3 (H) 4.0 - 10.5 K/uL   RBC 5.19 4.22 - 5.81 MIL/uL   Hemoglobin 16.7 13.0 - 17.0 g/dL   HCT 47.3 39.0 - 52.0  %   MCV 91.1 78.0 - 100.0 fL   MCH 32.2 26.0 - 34.0 pg   MCHC 35.3 30.0 - 36.0 g/dL   RDW 13.6 11.5 - 15.5 %   Platelets 280 150 - 400 K/uL    Comment: Performed at [facility], [address] [place], [place] [address]   Ct Abdomen Pelvis W Contrast  Result Date: 12/19/2017 CLINICAL DATA:  Vomiting and abdominal pain. EXAM: CT ABDOMEN AND PELVIS WITH CONTRAST TECHNIQUE: Multidetector CT imaging of the abdomen and pelvis was performed using the standard protocol following bolus administration of intravenous contrast. CONTRAST:  <See Chart> ISOVUE-300 IOPAMIDOL (ISOVUE-300) INJECTION 61%, 36m ISOVUE-300 IOPAMIDOL (ISOVUE-300) INJECTION 61%, 1053mISOVUE-300 IOPAMIDOL (ISOVUE-300) INJECTION 61% COMPARISON:  09/20/2017. FINDINGS: Lower chest: Lung bases show no acute findings. Heart is at the upper limits of normal in size. No pericardial or pleural effusion. Prepericardiac lymph nodes are subcentimeter in short axis size. Distal esophagus is dilated and fluid-filled. Hepatobiliary: Liver is slightly decreased in attenuation diffusely. Liver and gallbladder are otherwise unremarkable. No biliary ductal dilatation. Pancreas: Negative. Spleen: Negative. Adrenals/Urinary Tract: Adrenal glands are unremarkable. Tiny stone in the right kidney. Kidneys are otherwise unremarkable. Ureters are decompressed. Bladder is low in volume. Stomach/Bowel: Stomach is dilated and fluid-filled. Duodenum and proximal jejunum are decompressed. There is dilated and fluid-filled distal jejunum and ileum, to the level of an apparent small bowel stricture in the right lower quadrant (series 2, image 60), measuring approximately 6.7 cm. Remainder of the small bowel is decompressed. Appendix and colon are unremarkable. Vascular/Lymphatic: Atherosclerotic calcification of the arterial vasculature without abdominal aortic aneurysm. Retroperitoneal lymph nodes are not enlarged by CT size criteria. Circumaortic left renal vein.  Reproductive: Prostatectomy. Other: Mild presacral edema. Trace interloop fluid. Mesenteries and peritoneum are otherwise unremarkable. No free air. Musculoskeletal: Degenerative changes in the spine. No worrisome lytic or sclerotic lesions. IMPRESSION: 1. 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 2. Hepatic steatosis. 3. Tiny right renal stone. 4.  Aortic atherosclerosis (ICD10-170.0). Electronically Signed   By: MeLorin Picket.D.   On: 12/19/2017 14:18    Pending Labs Unresulted Labs (From admission, onward)    Start     Ordered   12/19/17 1046  Urinalysis,  Routine w reflex microscopic  STAT,   STAT     12/19/17 1045   Signed and Held  HIV antibody (Routine Testing)  Once,   R     Signed and Held   Signed and Held  CBC  (enoxaparin (LOVENOX)    CrCl >/= 30 ml/min)  Once,   R    Comments:  Baseline for enoxaparin therapy IF NOT ALREADY DRAWN.  Notify MD if PLT < 100 K.    Signed and Held   Signed and Held  Creatinine, serum  (enoxaparin (LOVENOX)    CrCl >/= 30 ml/min)  Once,   R    Comments:  Baseline for enoxaparin therapy IF NOT ALREADY DRAWN.    Signed and Held   Signed and Held  Creatinine, serum  (enoxaparin (LOVENOX)    CrCl >/= 30 ml/min)  Weekly,   R    Comments:  while on enoxaparin therapy    Signed and Held   Signed and Held  CBC  Tomorrow morning,   R     Signed and Held   Signed and Held  Comprehensive metabolic panel  Tomorrow morning,   R     Signed and Held          Vitals/Pain Today's Vitals   12/19/17 1330 12/19/17 1400 12/19/17 1430 12/19/17 1634  BP: (!) 151/87 (!) 158/95 (!) 147/89   Pulse: 80 98 74   Resp:      Temp:      TempSrc:      SpO2: 96% 98% 96%   Weight:      Height:      PainSc:    5     Isolation Precautions No active isolations  Medications Medications  ondansetron (ZOFRAN) injection 4 mg (4 mg Intravenous Given 12/19/17 1144)  sodium chloride 0.9 % bolus 1,000 mL (0  mLs Intravenous Stopped 12/19/17 1245)  fentaNYL (SUBLIMAZE) injection 50 mcg (50 mcg Intravenous Given 12/19/17 1144)  iopamidol (ISOVUE-300) 61 % injection 30 mL (30 mLs Oral Contrast Given 12/19/17 1208)  iopamidol (ISOVUE-300) 61 % injection 100 mL (100 mLs Intravenous Contrast Given 12/19/17 1348)  fentaNYL (SUBLIMAZE) injection 50 mcg (50 mcg Intravenous Given 12/19/17 1605)  ondansetron (ZOFRAN) injection 4 mg (4 mg Intravenous Given 12/19/17 1618)    Mobility walks

## 2017-12-19 NOTE — ED Notes (Signed)
Pt states he does not need anything at this time.

## 2017-12-19 NOTE — Consult Note (Signed)
Urology Consult  Referring physician: Dr. Roderic Trevino Reason for referral: small bowel obstruction after prostatectomy  Chief Complaint: abdominal pain  History of Present Illness: [name] is a 62yo with a hx of prostate cancer s/p robotic radical prostectomy on 12/06/2017 with Dr. Alinda Trevino who presented to [facility] with a 1 day history of worsening abdominal distention, nausea and vomiting. The patient has had similar episodes that occur yearly and previously has been treated with steroids and antibiotics with resolution of his ileus/bowel obstruction. He has been evaluated at [facility] for intermittent ileus. Ct scan from today shows a 7cm transition area/possible stricture in the ileum in the right lower quadrant. WBC count is 20.3. He denies any significant LUTS. He does have nausea and vomiting.  Past Medical History:  Diagnosis Date  . Cancer [facility])    Prostate  . Complication of anesthesia    difficulty waking up after anesthesia  . Crohn disease ([place]) 08/28/2017   pt denies  . Diabetes ([place])   . High cholesterol    Past Surgical History:  Procedure Laterality Date  . COLONOSCOPY  2013  . LYMPHADENECTOMY Bilateral 12/06/2017   Procedure: LYMPHADENECTOMY, PELVIC;  Surgeon: Brett Bring, MD;  Location: [facility];  Service: Urology;  Laterality: Bilateral;  . PROSTATE BIOPSY N/A 09/28/2017   Procedure: BIOPSY TRANSRECTAL ULTRASONIC PROSTATE (TUBP);  Surgeon: Brett Seal, MD;  Location: [facility];  Service: Urology;  Laterality: N/A;  . PROSTATECTOMY    . ROBOT ASSISTED LAPAROSCOPIC RADICAL PROSTATECTOMY N/A 12/06/2017   Procedure: XI ROBOTIC ASSISTED LAPAROSCOPIC RADICAL PROSTATECTOMY LEVEL 2;  Surgeon: Brett Bring, MD;  Location: [facility];  Service: Urology;  Laterality: N/A;  . TOOTH EXTRACTION  1999    Medications: I have reviewed the patient's current medications. Allergies: No Known Allergies  Family History  Problem Relation Age of Onset  .  Hemochromatosis Sister    Social History:  reports that he has never smoked. He has never used smokeless tobacco. He reports that he does not drink alcohol or use drugs.  Review of Systems  Gastrointestinal: Positive for abdominal pain, nausea and vomiting.  All other systems reviewed and are negative.   Physical Exam:  Vital signs in last 24 hours: Temp:  [98 F (36.7 C)] 98 F (36.7 C) (09/18 1042) Pulse Rate:  [66-98] 74 (09/18 1430) Resp:  [20] 20 (09/18 1042) BP: (103-158)/(62-95) 147/89 (09/18 1430) SpO2:  [86 %-100 %] 96 % (09/18 1430) Weight:  [106.6 kg] 106.6 kg (09/18 1043) Physical Exam  Constitutional: He is oriented to person, place, and time. He appears well-developed and well-nourished.  HENT:  Head: Atraumatic.  Eyes: Pupils are equal, round, and reactive to light. EOM are normal.  Neck: Normal range of motion. No thyromegaly present.  Cardiovascular: Normal rate and regular rhythm.  Respiratory: Effort normal. No respiratory distress.  GI: Soft. He exhibits distension. He exhibits no mass. There is tenderness. There is no rebound and no guarding.  Musculoskeletal: Normal range of motion. He exhibits no edema.  Neurological: He is alert and oriented to person, place, and time.  Skin: Skin is warm and dry.  Psychiatric: He has a normal mood and affect. His behavior is normal. Judgment and thought content normal.    Laboratory Data:  Results for orders placed or performed during the Trevino encounter of 12/19/17 (from the past 72 hour(s))  POC CBG, ED     Status: Abnormal   Collection Time: 12/19/17 11:01 AM  Result Value Ref Range   Glucose-Capillary 156 (H) 70 - 99 mg/dL  Lipase, blood     Status: None   Collection Time: 12/19/17 11:26 AM  Result Value Ref Range   Lipase 34 11 - 51 U/L    Comment: Performed at [facility], [address], [place], [place] [address]  Comprehensive metabolic panel     Status: Abnormal   Collection Time: 12/19/17 11:26 AM   Result Value Ref Range   Sodium 137 135 - 145 mmol/L   Potassium 3.9 3.5 - 5.1 mmol/L   Chloride 101 98 - 111 mmol/L   CO2 23 22 - 32 mmol/L   Glucose, Bld 167 (H) 70 - 99 mg/dL   BUN 9 8 - 23 mg/dL   Creatinine, Ser 0.89 0.61 - 1.24 mg/dL   Calcium 9.4 8.9 - 10.3 mg/dL   Total Protein 8.0 6.5 - 8.1 g/dL   Albumin 4.0 3.5 - 5.0 g/dL   AST 25 15 - 41 U/L   ALT 22 0 - 44 U/L   Alkaline Phosphatase 77 38 - 126 U/L   Total Bilirubin 1.4 (H) 0.3 - 1.2 mg/dL   GFR calc non Af Amer >60 >60 mL/min   GFR calc Af Amer >60 >60 mL/min    Comment: (NOTE) The eGFR has been calculated using the CKD EPI equation. This calculation has not been validated in all clinical situations. eGFR's persistently <60 mL/min signify possible Chronic Kidney Disease.    Anion gap 13 5 - 15    Comment: Performed at [facility], [address], [place], [place] [address]  CBC     Status: Abnormal   Collection Time: 12/19/17 11:26 AM  Result Value Ref Range   WBC 20.3 (H) 4.0 - 10.5 K/uL   RBC 5.19 4.22 - 5.81 MIL/uL   Hemoglobin 16.7 13.0 - 17.0 g/dL   HCT 47.3 39.0 - 52.0 %   MCV 91.1 78.0 - 100.0 fL   MCH 32.2 26.0 - 34.0 pg   MCHC 35.3 30.0 - 36.0 g/dL   RDW 13.6 11.5 - 15.5 %   Platelets 280 150 - 400 K/uL    Comment: Performed at [facility], [address], [place], [place] [address]   No results found for this or any previous visit (from the past 240 hour(s)). Creatinine: Recent Labs    12/19/17 1126  CREATININE 0.89   Baseline Creatinine: 0.9  Impression/Assessment:  62yo with a hx of prostate cancer and a small bowel obstruction  Plan:  1. I discussed the case with Dr. Roderic Trevino and Dr. Arnoldo Trevino at [facility]. He will be started on steroids and antibiotics for possible inflammatory process in his ileum/Crohns. If he fails to improve on medical therapy he may require general surgery intervention.  2. From a urologic standpoint the patient is urinating without issues and requires no further  urologic interventions at this time  Nicolette Bang 12/19/2017, 4:53 PM

## 2017-12-19 NOTE — H&P (Signed)
History and Physical    [name] HKV:[id number] DOB: January 12, 1956 DOA: 12/19/2017  PCP: Curlene Labrum, MD  Patient coming from: Home  I have personally briefly reviewed patient's old medical records in [place]  Chief Complaint: Abdominal pain and vomiting  HPI: Brett Trevino is a 62 y.o. male with medical history significant of diabetes, hyperlipidemia, prostate cancer status post prostatectomy on 9/5.  He was discharged from hospital on 9/6.  He reports that he was doing fairly well since his discharge.  Overnight, he is developed worsening abdominal pain which she describes as a cramping pain in the lower abdomen.  He said associated vomiting, throwing up clear fluid.  He has had several episodes overnight and into this morning.  He threw up in the emergency room.  Denies any abdominal distention.  Says his last bowel movement was yesterday.  He has not had any fever.  No dysuria.  ED Course: Noted to have an elevated WBC count of 20,000.  Afebrile with stable vital signs.  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  He is been referred for admission.  Review of Systems: As per HPI otherwise 10 point review of systems negative.    Past Medical History:  Diagnosis Date  . Cancer [facility])    Prostate  . Complication of anesthesia    difficulty waking up after anesthesia  . Crohn disease ([place]) 08/28/2017   pt denies  . Diabetes ([place])   . High cholesterol     Past Surgical History:  Procedure Laterality Date  . COLONOSCOPY  2013  . LYMPHADENECTOMY Bilateral 12/06/2017   Procedure: LYMPHADENECTOMY, PELVIC;  Surgeon: Raynelle Bring, MD;  Location: [facility];  Service: Urology;  Laterality: Bilateral;  . PROSTATE BIOPSY N/A 09/28/2017   Procedure: BIOPSY TRANSRECTAL ULTRASONIC PROSTATE (TUBP);  Surgeon: Irine Seal, MD;  Location: [facility];  Service: Urology;  Laterality: N/A;  . PROSTATECTOMY    .  ROBOT ASSISTED LAPAROSCOPIC RADICAL PROSTATECTOMY N/A 12/06/2017   Procedure: XI ROBOTIC ASSISTED LAPAROSCOPIC RADICAL PROSTATECTOMY LEVEL 2;  Surgeon: Raynelle Bring, MD;  Location: [facility];  Service: Urology;  Laterality: N/A;  . TOOTH EXTRACTION  1999    Social History:  reports that he has never smoked. He has never used smokeless tobacco. He reports that he does not drink alcohol or use drugs.  No Known Allergies  Family History  Problem Relation Age of Onset  . Hemochromatosis Sister      Prior to Admission medications   Medication Sig Start Date End Date Taking? Authorizing Provider  atorvastatin (LIPITOR) 10 MG tablet Take 10 mg by mouth every evening.     [provider]  metFORMIN (GLUCOPHAGE) 500 MG tablet Take 500 mg by mouth 2 (two) times daily with a meal.     [provider]  sulfamethoxazole-trimethoprim (BACTRIM DS,SEPTRA DS) 800-160 MG tablet Take 1 tablet by mouth 2 (two) times daily. Start the day prior to foley removal appointment 12/06/17   Debbrah Alar, PA-C  traMADol (ULTRAM) 50 MG tablet Take 1-2 tablets (50-100 mg total) by mouth every 6 (six) hours as needed for moderate pain or severe pain. 12/06/17   Debbrah Alar, PA-C    Physical Exam: Vitals:   12/19/17 1315 12/19/17 1330 12/19/17 1400 12/19/17 1430  BP:  (!) 151/87 (!) 158/95 (!) 147/89  Pulse: 66 80 98 74  Resp:      Temp:  TempSrc:      SpO2: 97% 96% 98% 96%  Weight:      Height:        Constitutional: NAD, calm, comfortable Eyes: PERRL, lids and conjunctivae normal ENMT: Mucous membranes are moist. Posterior pharynx clear of any exudate or lesions.Normal dentition.  Neck: normal, supple, no masses, no thyromegaly Respiratory: clear to auscultation bilaterally, no wheezing, no crackles. Normal respiratory effort. No accessory muscle use.  Cardiovascular: Regular rate and rhythm, no murmurs / rubs / gallops. No extremity edema. 2+ pedal pulses. No carotid bruits.  Abdomen:  Lower abdominal tenderness, soft, sluggish bowel sounds, appears to be distended.  Musculoskeletal: no clubbing / cyanosis. No joint deformity upper and lower extremities. Good ROM, no contractures. Normal muscle tone.  Skin: no rashes, lesions, ulcers. No induration Neurologic: CN 2-12 grossly intact. Sensation intact, DTR normal. Strength 5/5 in all 4.  Psychiatric: Normal judgment and insight. Alert and oriented x 3. Normal mood.    Labs on Admission: I have personally reviewed following labs and imaging studies  CBC: Recent Labs  Lab 12/19/17 1126  WBC 20.3*  HGB 16.7  HCT 47.3  MCV 91.1  PLT 810   Basic Metabolic Panel: Recent Labs  Lab 12/19/17 1126  NA 137  K 3.9  CL 101  CO2 23  GLUCOSE 167*  BUN 9  CREATININE 0.89  CALCIUM 9.4   GFR: Estimated Creatinine Clearance: 97.6 mL/min (by C-G formula based on SCr of 0.89 mg/dL). Liver Function Tests: Recent Labs  Lab 12/19/17 1126  AST 25  ALT 22  ALKPHOS 77  BILITOT 1.4*  PROT 8.0  ALBUMIN 4.0   Recent Labs  Lab 12/19/17 1126  LIPASE 34   No results for input(s): AMMONIA in the last 168 hours. Coagulation Profile: No results for input(s): INR, PROTIME in the last 168 hours. Cardiac Enzymes: No results for input(s): CKTOTAL, CKMB, CKMBINDEX, TROPONINI in the last 168 hours. BNP (last 3 results) No results for input(s): PROBNP in the last 8760 hours. HbA1C: No results for input(s): HGBA1C in the last 72 hours. CBG: Recent Labs  Lab 12/19/17 1101  GLUCAP 156*   Lipid Profile: No results for input(s): CHOL, HDL, LDLCALC, TRIG, CHOLHDL, LDLDIRECT in the last 72 hours. Thyroid Function Tests: No results for input(s): TSH, T4TOTAL, FREET4, T3FREE, THYROIDAB in the last 72 hours. Anemia Panel: No results for input(s): VITAMINB12, FOLATE, FERRITIN, TIBC, IRON, RETICCTPCT in the last 72 hours. Urine analysis: No results found for: COLORURINE, APPEARANCEUR, [place], [place], GLUCOSEU, HGBUR, BILIRUBINUR,  KETONESUR, PROTEINUR, UROBILINOGEN, NITRITE, LEUKOCYTESUR  Radiological Exams on Admission: Ct Abdomen Pelvis W Contrast  Result Date: 12/19/2017 CLINICAL DATA:  Vomiting and abdominal pain. EXAM: CT ABDOMEN AND PELVIS WITH CONTRAST TECHNIQUE: Multidetector CT imaging of the abdomen and pelvis was performed using the standard protocol following bolus administration of intravenous contrast. CONTRAST:  <See Chart> ISOVUE-300 IOPAMIDOL (ISOVUE-300) INJECTION 61%, 27mL ISOVUE-300 IOPAMIDOL (ISOVUE-300) INJECTION 61%, 158mL ISOVUE-300 IOPAMIDOL (ISOVUE-300) INJECTION 61% COMPARISON:  09/20/2017. FINDINGS: Lower chest: Lung bases show no acute findings. Heart is at the upper limits of normal in size. No pericardial or pleural effusion. Prepericardiac lymph nodes are subcentimeter in short axis size. Distal esophagus is dilated and fluid-filled. Hepatobiliary: Liver is slightly decreased in attenuation diffusely. Liver and gallbladder are otherwise unremarkable. No biliary ductal dilatation. Pancreas: Negative. Spleen: Negative. Adrenals/Urinary Tract: Adrenal glands are unremarkable. Tiny stone in the right kidney. Kidneys are otherwise unremarkable. Ureters are decompressed. Bladder is low in volume. Stomach/Bowel: Stomach is  dilated and fluid-filled. Duodenum and proximal jejunum are decompressed. There is dilated and fluid-filled distal jejunum and ileum, to the level of an apparent small bowel stricture in the right lower quadrant (series 2, image 60), measuring approximately 6.7 cm. Remainder of the small bowel is decompressed. Appendix and colon are unremarkable. Vascular/Lymphatic: Atherosclerotic calcification of the arterial vasculature without abdominal aortic aneurysm. Retroperitoneal lymph nodes are not enlarged by CT size criteria. Circumaortic left renal vein. Reproductive: Prostatectomy. Other: Mild presacral edema. Trace interloop fluid. Mesenteries and peritoneum are otherwise unremarkable. No free  air. Musculoskeletal: Degenerative changes in the spine. No worrisome lytic or sclerotic lesions. IMPRESSION: 1. 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 2. Hepatic steatosis. 3. Tiny right renal stone. 4.  Aortic atherosclerosis (ICD10-170.0). Electronically Signed   By: Lorin Picket M.D.   On: 12/19/2017 14:18    Assessment/Plan Active Problems:   Prostate cancer (HCC)   SBO (small bowel obstruction) (HCC)   Vomiting   Type 2 diabetes mellitus with hyperlipidemia ([place])    1. Small bowel obstruction.  Patient noted to have high-grade small bowel obstruction with ileal stricture.  With his recent prostate surgery, he was seen by urology who has discussed his case/reviewed CT scan with general surgery at [facility].  Recommendations are for transfer to [facility] for further general surgery evaluation.  We will keep the patient n.p.o. with NG tube decompression.  Continue on IV fluids. 2. Ileal stricture.  Interestingly, patient has similar episode in 08/2017 when he was admitted in [place].  At that time, he says he was treated with antibiotics.  He has seen Dr. Laural Golden (gastroenterology) in the office to discuss the possibility of Crohn's disease.  He had a repeat CT scan done in 09/2017 that showed improvement of inflammatory changes.  Current CT was also reviewed with Dr. Laural Golden and there is a possibility that he may have inflammatory bowel disease.  We will start the patient on intravenous antibiotics and steroids. 3. Diabetes.  Hold on metformin.  Start on sliding scale insulin. 4. Hyperlipidemia.  Hold statin for now. 5. Prostate cancer.  Status post prostatectomy.  Follow-up with urology.  DVT prophylaxis: lovenox  Code Status: full code  Family Communication: discussed with wife at the bedside  Disposition Plan: transfer to [facility] for further management  Consults called: urology Alyson Ingles), general surgery (Dr.  Marcello Moores) Admission status: inpatient, medsurg   Kathie Dike MD [facility] Pager [phone]  If 7PM-7AM, please contact night-coverage [email or web address] Password [email or web address]  12/19/2017, 4:34 PM

## 2017-12-19 NOTE — ED Notes (Signed)
[facility] at bedside 

## 2017-12-20 ENCOUNTER — Other Ambulatory Visit: Payer: Self-pay

## 2017-12-20 ENCOUNTER — Inpatient Hospital Stay (HOSPITAL_COMMUNITY): Payer: BLUE CROSS/BLUE SHIELD

## 2017-12-20 ENCOUNTER — Encounter (HOSPITAL_COMMUNITY): Payer: Self-pay

## 2017-12-20 DIAGNOSIS — R112 Nausea with vomiting, unspecified: Secondary | ICD-10-CM

## 2017-12-20 DIAGNOSIS — K56609 Unspecified intestinal obstruction, unspecified as to partial versus complete obstruction: Secondary | ICD-10-CM

## 2017-12-20 DIAGNOSIS — E1169 Type 2 diabetes mellitus with other specified complication: Secondary | ICD-10-CM

## 2017-12-20 DIAGNOSIS — E785 Hyperlipidemia, unspecified: Secondary | ICD-10-CM

## 2017-12-20 DIAGNOSIS — C61 Malignant neoplasm of prostate: Secondary | ICD-10-CM

## 2017-12-20 LAB — CBC
HCT: 43.7 % (ref 39.0–52.0)
HEMOGLOBIN: 14.9 g/dL (ref 13.0–17.0)
MCH: 31.2 pg (ref 26.0–34.0)
MCHC: 34.1 g/dL (ref 30.0–36.0)
MCV: 91.4 fL (ref 78.0–100.0)
PLATELETS: 310 10*3/uL (ref 150–400)
RBC: 4.78 MIL/uL (ref 4.22–5.81)
RDW: 13.7 % (ref 11.5–15.5)
WBC: 11.2 10*3/uL — AB (ref 4.0–10.5)

## 2017-12-20 LAB — COMPREHENSIVE METABOLIC PANEL
ALK PHOS: 62 U/L (ref 38–126)
ALT: 17 U/L (ref 0–44)
AST: 19 U/L (ref 15–41)
Albumin: 3.3 g/dL — ABNORMAL LOW (ref 3.5–5.0)
Anion gap: 9 (ref 5–15)
BUN: 11 mg/dL (ref 8–23)
CALCIUM: 9.1 mg/dL (ref 8.9–10.3)
CHLORIDE: 105 mmol/L (ref 98–111)
CO2: 27 mmol/L (ref 22–32)
Creatinine, Ser: 0.89 mg/dL (ref 0.61–1.24)
GFR calc Af Amer: >60 mL/min (ref >60)
GFR calc non Af Amer: >60 mL/min (ref >60)
GLUCOSE: 189 mg/dL — AB (ref 70–99)
Potassium: 4.4 mmol/L (ref 3.5–5.1)
SODIUM: 141 mmol/L (ref 135–145)
Total Bilirubin: 0.9 mg/dL (ref 0.3–1.2)
Total Protein: 7.2 g/dL (ref 6.5–8.1)

## 2017-12-20 LAB — HIV ANTIBODY (ROUTINE TESTING W REFLEX): HIV Screen 4th Generation wRfx: NONREACTIVE

## 2017-12-20 LAB — GLUCOSE, CAPILLARY
GLUCOSE-CAPILLARY: 174 mg/dL — AB (ref 70–99)
GLUCOSE-CAPILLARY: 170 mg/dL — AB (ref 70–99)
Glucose-Capillary: 162 mg/dL — ABNORMAL HIGH (ref 70–99)
Glucose-Capillary: 172 mg/dL — ABNORMAL HIGH (ref 70–99)
Glucose-Capillary: 132 mg/dL — ABNORMAL HIGH (ref 70–99)

## 2017-12-20 MED ORDER — DIATRIZOATE MEGLUMINE & SODIUM 66-10 % PO SOLN
90.0000 mL | Freq: Once | ORAL | Status: AC
  Administered 2017-12-20: 90 mL via NASOGASTRIC
  Filled 2017-12-20: qty 90

## 2017-12-20 MED ORDER — INFLUENZA VAC SPLIT QUAD 0.5 ML IM SUSY
0.5000 mL | PREFILLED_SYRINGE | INTRAMUSCULAR | Status: AC
  Administered 2017-12-21: 0.5 mL via INTRAMUSCULAR
  Filled 2017-12-20: qty 0.5

## 2017-12-20 MED ORDER — METHYLPREDNISOLONE SODIUM SUCC 125 MG IJ SOLR
60.0000 mg | INTRAMUSCULAR | Status: DC
  Administered 2017-12-21 – 2017-12-22 (×2): 60 mg via INTRAVENOUS
  Filled 2017-12-20 (×2): qty 2

## 2017-12-20 NOTE — Progress Notes (Signed)
Gave gastrografin at 0940. Radiology called at that time to coordinate time for scan.

## 2017-12-20 NOTE — Progress Notes (Signed)
PROGRESS NOTE    Brett Trevino  TML:[id number] DOB: 01-05-1956 DOA: 12/19/2017 PCP: Curlene Labrum, MD   Brief Narrative:  62 year old male with a history of type 2 diabetes, prostate cancer status post robotic assisted laparoscopic radical prostatectomy on 9/5 by Dr. Noralee Chars, hyperlipidemia came to the hospital with complaints of abdominal pain nausea and vomiting.  Patient had this off and on for last several months and has previously been evaluated by GI and the work-up was negative.  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  Case was discussed by Dr. Arnoldo Morale, Dr. Alyson Ingles with [place] surgery here at [facility] who recommended transferring the patient here for further management.  NG tube was placed which made it feel a whole lot better.  He was also empirically started on steroids and antibiotics.   Assessment & Plan:   Active Problems:   Prostate cancer ([place])   SBO (small bowel obstruction) (HCC)   Vomiting   Type 2 diabetes mellitus with hyperlipidemia (HCC)   Abdominal pain, nausea and vomiting, improved High-grade small bowel obstruction, improving. Possible inflammatory bowel disease with ileal stricture - Currently NG tube is in place to low intermittent suction.  Currently is n.p.o., appreciate general surgery input. - Conservative management at this time.  Surgery plans on small bowel protocol with Gastrografin study.  Antiemetics, pain control, supportive care. -Continue IV fluids, Accu-Cheks every 6 hours while n.p.o. -Continue Cipro/Flagyl.  Solu-Medrol 60 mg IV every 24 hours. -Consulted GI for their input.  Prostate cancer status post laparoscopic radical robotic prostatectomy 9/5 - Per urology he is passing urine and stable from their perspective.  Continue to provide supportive care.  Diabetes mellitus type 2 - Accu-Cheks and sliding scale.  Metformin is on  hold  Hyperlipidemia - Resume statin when appropriate  DVT prophylaxis: Lovenox Code Status: Full code Family Communication: Wife at bedside Disposition Plan: Maintain inpatient stay until his bowel function has improved.  Consultants:   General surgery  Gastroenterology  Procedures:   None  Antimicrobials:   Cipro and Flagyl 9/18   Subjective: States he feels a little better this morning and is able to pass some gas.  Review of Systems Otherwise negative except as per HPI, including: General = no fevers, chills, dizziness, malaise, fatigue HEENT/EYES = negative for pain, redness, loss of vision, double vision, blurred vision, loss of hearing, sore throat, hoarseness, dysphagia Cardiovascular= negative for chest pain, palpitation, murmurs, lower extremity swelling Respiratory/lungs= negative for shortness of breath, cough, hemoptysis, wheezing, mucus production Gastrointestinal= negative for nausea, vomiting,, abdominal pain, melena, hematemesis Genitourinary= negative for Dysuria, Hematuria, Change in Urinary Frequency MSK = Negative for arthralgia, myalgias, Back Pain, Joint swelling  Neurology= Negative for headache, seizures, numbness, tingling  Psychiatry= Negative for anxiety, depression, suicidal and homocidal ideation Allergy/Immunology= Medication/Food allergy as listed  Skin= Negative for Rash, lesions, ulcers, itching   Objective: Vitals:   12/19/17 1839 12/19/17 1944 12/19/17 2010 12/20/17 0431  BP: 117/75  133/81 117/72  Pulse: (!) 111  95 79  Resp:   19 20  Temp:   98.6 F (37 C) 98.5 F (36.9 C)  TempSrc:      SpO2: 91%  96% 93%  Weight:  104.3 kg    Height:  5\' 7"  (1.702 m)      Intake/Output Summary (Last 24 hours) at 12/20/2017 1103 Last data filed at 12/20/2017 0815 Gross per 24 hour  Intake 1262.22 ml  Output 300 ml  Net 962.22 ml   Filed Weights   12/19/17 1043 12/19/17 1944  Weight: 106.6 kg 104.3 kg    Examination:  General  exam: Appears calm and comfortable, NG tube is in place Respiratory system: Clear to auscultation. Respiratory effort normal. Cardiovascular system: S1 & S2 heard, RRR. No JVD, murmurs, rubs, gallops or clicks. No pedal edema. Gastrointestinal system: Abdomen is nontender nondistended, diminished bowel sounds. Central nervous system: Alert and oriented. No focal neurological deficits. Extremities: Symmetric 5 x 5 power. Skin: No rashes, lesions or ulcers Psychiatry: Judgement and insight appear normal. Mood & affect appropriate.     Data Reviewed:   CBC: Recent Labs  Lab 12/19/17 1126 12/19/17 2044 12/20/17 0511  WBC 20.3* 15.7* 11.2*  HGB 16.7 15.8 14.9  HCT 47.3 45.9 43.7  MCV 91.1 93.7 91.4  PLT 280 288 371   Basic Metabolic Panel: Recent Labs  Lab 12/19/17 1126 12/19/17 2044 12/20/17 0511  NA 137  --  141  K 3.9  --  4.4  CL 101  --  105  CO2 23  --  27  GLUCOSE 167*  --  189*  BUN 9  --  11  CREATININE 0.89 0.89 0.89  CALCIUM 9.4  --  9.1   GFR: Estimated Creatinine Clearance: 96.5 mL/min (by C-G formula based on SCr of 0.89 mg/dL). Liver Function Tests: Recent Labs  Lab 12/19/17 1126 12/20/17 0511  AST 25 19  ALT 22 17  ALKPHOS 77 62  BILITOT 1.4* 0.9  PROT 8.0 7.2  ALBUMIN 4.0 3.3*   Recent Labs  Lab 12/19/17 1126  LIPASE 34   No results for input(s): AMMONIA in the last 168 hours. Coagulation Profile: No results for input(s): INR, PROTIME in the last 168 hours. Cardiac Enzymes: No results for input(s): CKTOTAL, CKMB, CKMBINDEX, TROPONINI in the last 168 hours. BNP (last 3 results) No results for input(s): PROBNP in the last 8760 hours. HbA1C: No results for input(s): HGBA1C in the last 72 hours. CBG: Recent Labs  Lab 12/19/17 1101 12/19/17 2012 12/19/17 2352 12/20/17 0428 12/20/17 0743  GLUCAP 156* 139* 143* 162* 172*   Lipid Profile: No results for input(s): CHOL, HDL, LDLCALC, TRIG, CHOLHDL, LDLDIRECT in the last 72  hours. Thyroid Function Tests: No results for input(s): TSH, T4TOTAL, FREET4, T3FREE, THYROIDAB in the last 72 hours. Anemia Panel: No results for input(s): VITAMINB12, FOLATE, FERRITIN, TIBC, IRON, RETICCTPCT in the last 72 hours. Sepsis Labs: No results for input(s): PROCALCITON, LATICACIDVEN in the last 168 hours.  No results found for this or any previous visit (from the past 240 hour(s)).       Radiology Studies: Ct Abdomen Pelvis W Contrast  Result Date: 12/19/2017 CLINICAL DATA:  Vomiting and abdominal pain. EXAM: CT ABDOMEN AND PELVIS WITH CONTRAST TECHNIQUE: Multidetector CT imaging of the abdomen and pelvis was performed using the standard protocol following bolus administration of intravenous contrast. CONTRAST:  <See Chart> ISOVUE-300 IOPAMIDOL (ISOVUE-300) INJECTION 61%, 25mL ISOVUE-300 IOPAMIDOL (ISOVUE-300) INJECTION 61%, 155mL ISOVUE-300 IOPAMIDOL (ISOVUE-300) INJECTION 61% COMPARISON:  09/20/2017. FINDINGS: Lower chest: Lung bases show no acute findings. Heart is at the upper limits of normal in size. No pericardial or pleural effusion. Prepericardiac lymph nodes are subcentimeter in short axis size. Distal esophagus is dilated and fluid-filled. Hepatobiliary: Liver is slightly decreased in attenuation diffusely. Liver and gallbladder are otherwise unremarkable. No biliary ductal dilatation. Pancreas: Negative. Spleen: Negative. Adrenals/Urinary Tract: Adrenal glands are unremarkable. Tiny stone  in the right kidney. Kidneys are otherwise unremarkable. Ureters are decompressed. Bladder is low in volume. Stomach/Bowel: Stomach is dilated and fluid-filled. Duodenum and proximal jejunum are decompressed. There is dilated and fluid-filled distal jejunum and ileum, to the level of an apparent small bowel stricture in the right lower quadrant (series 2, image 60), measuring approximately 6.7 cm. Remainder of the small bowel is decompressed. Appendix and colon are unremarkable.  Vascular/Lymphatic: Atherosclerotic calcification of the arterial vasculature without abdominal aortic aneurysm. Retroperitoneal lymph nodes are not enlarged by CT size criteria. Circumaortic left renal vein. Reproductive: Prostatectomy. Other: Mild presacral edema. Trace interloop fluid. Mesenteries and peritoneum are otherwise unremarkable. No free air. Musculoskeletal: Degenerative changes in the spine. No worrisome lytic or sclerotic lesions. IMPRESSION: 1. 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 2. Hepatic steatosis. 3. Tiny right renal stone. 4.  Aortic atherosclerosis (ICD10-170.0). Electronically Signed   By: Lorin Picket M.D.   On: 12/19/2017 14:18   Dg Chest Portable 1 View  Result Date: 12/19/2017 CLINICAL DATA:  Nasogastric tube placement EXAM: PORTABLE CHEST 1 VIEW COMPARISON:  None. FINDINGS: The side port of the nasogastric tube projects over the stomach. There is shallow lung inflation with bibasilar atelectasis. No pleural effusion or pneumothorax. Normal cardiomediastinal contours. IMPRESSION: Nasogastric tube side port projects over the stomach. Electronically Signed   By: Ulyses Jarred M.D.   On: 12/19/2017 17:26        Scheduled Meds: . enoxaparin (LOVENOX) injection  40 mg Subcutaneous Q24H  . [START ON 12/21/2017] Influenza vac split quadrivalent PF  0.5 mL Intramuscular Tomorrow-1000  . insulin aspart  0-9 Units Subcutaneous Q4H  . methylPREDNISolone (SOLU-MEDROL) injection  60 mg Intravenous Q12H   Continuous Infusions: . ciprofloxacin 400 mg (12/20/17 0938)  . metronidazole 500 mg (12/20/17 0815)     LOS: 1 day   Time spent= 40 mins    Ankit Arsenio Loader, MD [facility] Pager [phone]   If 7PM-7AM, please contact night-coverage [email or web address] Password [email or web address] 12/20/2017, 11:03 AM

## 2017-12-20 NOTE — Consult Note (Addendum)
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  Colon appears normal  I think it is highly likely that he does have Crohn's disease.  This is likely etiology of his partial small bowel obstruction in May, and persistent CT abnormalities in the interim.  He also has a positive ASCA.  The stricture does not appear to be at the terminal ileum, and colonoscopy may or may not be diagnostic.  If there is an inflammatory component to the stricture he may benefit from steroids and/or immunosuppressive therapy.  However with recent recurrent admissions with high-grade small bowel obstruction resection of the stricture may be the best course ,followed by initiation of treatment for IBD.  #2 recent diagnosis of prostate cancer status post laparoscopic radical prostatectomy and bilateral pelvic lymphadenectomy 12/06/2017-recovering well  #3 adult onset diabetes mellitus #4.  Hyperlipidemia #5  + family history of colon cancer in an aunt  PLAN: Continue current management of small bowel obstruction as per surgery with serial abdominal films and NG decompression.  Hopefully he will open up some in the next couple of days.  He was started on IV Solu-Medrol very high dose at 60 mg every 12 hours-we will decrease to 60 mg daily IV  Patient was also started on Cipro and Flagyl -not sure necessary  If patient decompresses and can tolerate a bowel prep we can consider colonoscopy with intubation of the terminal ileum.  Will discuss further with Dr. Carlean Purl, thank you we will  follow with you   Amy [facility]  12/20/2017, 10:31 AM     [facility] Attending   I have taken an interval history, reviewed the chart and examined the patient. I agree with the Advanced Practitioner's note, impression and recommendations.    I agree with the excellent note by Amy. He must have Crohn's disease based upon the info available.  Years of episodic nausea and vomiting, also chronic loose stools/diarrhea, and 2+ recent CT's w/ ileal stricture (I viewed images) + prior response to steroids.  I think we should do a colonoscopy if he decompresses.  Agree w/ current care.  Not a good time to start biologics (recent prostatectomy) but that looks like where he is headed unless needs a surgical resection.  He wants to see [place] in [place] after hospitalization and that is ok w/ me  Gatha Mayer, MD, [facility] 12/20/2017 1:29 PM [phone]       HPI: Brett Trevino is a 62 y.o. male who was transferred from [facility] last evening after presenting to the emergency room with worsening abdominal pain over the past several days, then onset of nausea and vomiting.  On CT he was found to have a high-grade small bowel obstruction.  Was noted that the distal esophagus was dilated and fluid-filled as well as the stomach, there is also dilated and fluid-filled jejunum and ileum to the area of stricture in the right lower quadrant which measures 6.7 cm long. Decision was made by surgeon at [facility] to [facility]. Patient has recently diagnosed prostate cancer and underwent a robotic lap  radical prostatectomy and bilateral pelvic lymphadenectomy on 12/06/2017 and was discharged home on 12/07/2017.    He has history of adult onset diabetes mellitus and hyperlipidemia. He had been evaluated by [facility] in the office in May 2019 after a brief hospitalization at [facility] with partial small bowel obstruction. .  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  There were short segments of jejunal thickening in the right abdomen but no colonic wall thickening.  He says he was treated with antibiotics and steroids during that hospitalization and briefly thereafter.  IBD markers were done, ANCA negative and ASCA positive at 36.6 , 09/2017  Repeat CT in June 2019 for ongoing complaints of right lower quadrant pain showed a fatty liver and mild wall thickening of the terminal ileum with resolution of previous concerns for small bowel obstruction. No further workup was pursued.   He had had a remote colonoscopy done in [place] in 2013 which was normal to the cecum with no intubation of the ileocecal valve. EGD at same time with GERD.Marland Kitchen  Pt says that he has had intermittent episodes of abdominal cramping nausea vomiting and diarrhea dating back prior to 2013 He says they were very sporadic and usually would not occur more than once a year or so.  That is why he had a work-up done in 2013. Since the hospitalization in May at [facility] he had not had any recurrent episodes until earlier this week.  He has been having regular bowel movements, appetite is been good, weight has been stable, he relates mild lower abdominal discomfort off and on.  He has not had any prior abdominal surgery, family history negative for IBD positive for colon cancer in an aunt.  Since admit last night and placement of NG tube he feels better, no further vomiting.  He has not felt particularly distended.  He has been passing a little bit of gas, no bowel movement since Tuesday.  Surgery has seen in consultation and has him on SBO protocol imaging.    Past Medical History:  Diagnosis Date  . Cancer [facility])    Prostate  . Complication of anesthesia    difficulty waking up after anesthesia  . Crohn disease ([place]) 08/28/2017   pt denies  . Diabetes ([place])   . High  cholesterol     Past Surgical History:  Procedure Laterality Date  . COLONOSCOPY  2013  . LYMPHADENECTOMY Bilateral 12/06/2017   Procedure: LYMPHADENECTOMY, PELVIC;  Surgeon: Raynelle Bring, MD;  Location: [facility];  Service: Urology;  Laterality: Bilateral;  . PROSTATE BIOPSY N/A 09/28/2017   Procedure: BIOPSY TRANSRECTAL ULTRASONIC PROSTATE (TUBP);  Surgeon: Irine Seal, MD;  Location: [facility];  Service: Urology;  Laterality: N/A;  . PROSTATECTOMY    . ROBOT ASSISTED LAPAROSCOPIC RADICAL PROSTATECTOMY N/A 12/06/2017   Procedure: XI ROBOTIC ASSISTED LAPAROSCOPIC RADICAL PROSTATECTOMY LEVEL 2;  Surgeon: Raynelle Bring, MD;  Location: [facility];  Service: Urology;  Laterality: N/A;  . TOOTH EXTRACTION  1999    Prior to Admission medications   Medication Sig Start Date End Date Taking? Authorizing Provider  atorvastatin (LIPITOR) 10 MG tablet Take 10 mg by mouth every evening.    Yes [provider]  metFORMIN (GLUCOPHAGE) 500 MG tablet Take 500 mg by mouth 2 (two) times daily with a meal.    Yes [provider]    Current Facility-Administered  Medications  Medication Dose Route Frequency Provider Last Rate Last Dose  . acetaminophen (TYLENOL) tablet 650 mg  650 mg Oral Q6H PRN Kathie Dike, MD       Or  . acetaminophen (TYLENOL) suppository 650 mg  650 mg Rectal Q6H PRN Kathie Dike, MD   650 mg at 12/19/17 2147  . ciprofloxacin (CIPRO) IVPB 400 mg  400 mg Intravenous Q12H Kathie Dike, MD 200 mL/hr at 12/20/17 0938 400 mg at 12/20/17 0938  . enoxaparin (LOVENOX) injection 40 mg  40 mg Subcutaneous Q24H Kathie Dike, MD   40 mg at 12/19/17 2147  . fentaNYL (SUBLIMAZE) injection 50 mcg  50 mcg Intravenous Q2H PRN Schorr, Rhetta Mura, NP      . Derrill Memo ON 12/21/2017] Influenza vac split quadrivalent PF (FLUARIX) injection 0.5 mL  0.5 mL Intramuscular Tomorrow-1000 Memon, Jehanzeb, MD      . insulin aspart (novoLOG) injection 0-9 Units  0-9 Units Subcutaneous Q4H Kathie Dike, MD   2 Units at 12/20/17 1914  . methylPREDNISolone sodium succinate (SOLU-MEDROL) 125 mg/2 mL injection 60 mg  60 mg Intravenous Q12H Kathie Dike, MD   60 mg at 12/20/17 0809  . metroNIDAZOLE (FLAGYL) IVPB 500 mg  500 mg Intravenous Q8H Kathie Dike, MD 100 mL/hr at 12/20/17 0815 500 mg at 12/20/17 0815  . ondansetron (ZOFRAN) tablet 4 mg  4 mg Oral Q6H PRN Kathie Dike, MD       Or  . ondansetron (ZOFRAN) injection 4 mg  4 mg Intravenous Q6H PRN Kathie Dike, MD        Allergies as of 12/19/2017  . (No Known Allergies)    Family History  Problem Relation Age of Onset  . Hemochromatosis Sister     Social History   Social History Narrative   Married   [place] to [place] from [place] in 2018   3 daughters ([place], [place], [place]) 7 grandchildren   Non smoker, No EToH     Review of Systems: Pertinent positive and negative review of systems were noted in the above HPI section.  All other review of systems was otherwise negative.  Physical Exam: Vital signs in last 24 hours: Temp:  [98 F (36.7 C)-98.6 F (37 C)] 98.5 F (36.9 C) (09/19 0431) Pulse Rate:  [66-118] 79 (09/19 0431) Resp:  [19-20] 20 (09/19 0431) BP: (103-158)/(62-95) 117/72 (09/19 0431) SpO2:  [86 %-100 %] 93 % (09/19 0431) Weight:  [104.3 kg-106.6 kg] 104.3 kg (09/18 1944) Last BM Date: 12/19/17 General:   Alert,  Well-developed, well-nourished, pleasant and cooperative in NAD Head:  Normocephalic and atraumatic. Eyes:  Sclera clear, no icterus.   Conjunctiva pink. Ears:  Normal auditory acuity. Nose:  No deformity, discharge,  or lesions. Mouth:  No deformity or lesions.   Neck:  Supple; no masses or thyromegaly. Lungs:  Clear throughout to auscultation.   No wheezes, crackles, or rhonchi. Heart:  Regular rate and rhythm; no murmurs, clicks, rubs,  or gallops. Abdomen:  Soft, bowel sounds are present, no significant distention.  Port incisions are healing, he is tender in  the right lower quadrant significantly.  No palpable mass or hepatosplenomegaly Rectal:  Deferred  Msk:  Symmetrical without gross deformities. . Pulses:  Normal pulses noted. Extremities:  Without clubbing or edema. Neurologic:  Alert and  oriented x4;  grossly normal neurologically. Skin:  Intact without significant lesions or rashes.. Psych:  Alert and cooperative. Normal mood and affect.  Intake/Output from  previous day: 09/18 0701 - 09/19 0700 In: 1262.2 [IV Piggyback:1262.2] Out: 300 [Urine:300] Intake/Output this shift: No intake/output data recorded.  Lab Results: Recent Labs    12/19/17 1126 12/19/17 2044 12/20/17 0511  WBC 20.3* 15.7* 11.2*  HGB 16.7 15.8 14.9  HCT 47.3 45.9 43.7  PLT 280 288 310   BMET Recent Labs    12/19/17 1126 12/19/17 2044 12/20/17 0511  NA 137  --  141  K 3.9  --  4.4  CL 101  --  105  CO2 23  --  27  GLUCOSE 167*  --  189*  BUN 9  --  11  CREATININE 0.89 0.89 0.89  CALCIUM 9.4  --  9.1   LFT Recent Labs    12/20/17 0511  PROT 7.2  ALBUMIN 3.3*  AST 19  ALT 17  ALKPHOS 62  BILITOT 0.9   PT/INR No results for input(s): LABPROT, INR in the last 72 hours. Hepatitis Panel No results for input(s): HEPBSAG, HCVAB, HEPAIGM, HEPBIGM in the last 72 hours.    I

## 2017-12-20 NOTE — Consult Note (Signed)
[facility] Surgery Consult Note  Brett Trevino 1955-05-25  [id number].    Requesting MD: Brett Trevino Chief Complaint/Reason for Consult: SBO  HPI:  Brett Trevino is a 62yo male 2 weeks s/p Robotic assisted laparoscopic radical prostatectomy 12/06/17 by Dr. Alinda Trevino for prostate cancer, who was admitted to [facility] yesterday with SBO. Patient states that he started having crampy abdominal pain, nausea, and vomiting  9/17. Symptoms were progressive so he went to the ED. 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 NG tube was placed and the patient was started on cipro/flagyl as well as IV steroid for possible Crohn's flare. Today he is feeling better. Abdominal pain improving. Denies n/v. Passing a lot of flatus this morning. Last BM 9/17.  Of note, patient reports a similar episode in May of this year with crampy abdominal pain, nausea, and vomiting. He was admitted in [place] and sent home on antibiotics and steroids. States that she saw a gastroenterologist (Dr. Laural Trevino) who did several tests and did not think that he had Crohns. Last colonoscopy normal 10/2011 in [place]. Per records, there is still concern that he may have Crohns, therefore he was started on IV abx and steroids.  PMH significant for DM, HLD Anticoagulants: none Nonsmoker  ROS: Review of Systems  Constitutional: Negative.   HENT: Negative.   Eyes: Negative.   Respiratory: Negative.   Cardiovascular: Negative.   Gastrointestinal: Positive for abdominal pain, constipation, nausea and vomiting. Negative for blood in stool and melena.  Genitourinary: Negative.   Musculoskeletal: Negative.   Skin: Negative.   Neurological: Negative.    All systems reviewed and otherwise negative except for as above  Family History  Problem Relation Age of Onset  . Hemochromatosis Sister     Past Medical History:  Diagnosis Date  . Cancer  [facility])    Prostate  . Complication of anesthesia    difficulty waking up after anesthesia  . Crohn disease ([place]) 08/28/2017   pt denies  . Diabetes ([place])   . High cholesterol     Past Surgical History:  Procedure Laterality Date  . COLONOSCOPY  2013  . LYMPHADENECTOMY Bilateral 12/06/2017   Procedure: LYMPHADENECTOMY, PELVIC;  Surgeon: Brett Bring, MD;  Location: [facility];  Service: Urology;  Laterality: Bilateral;  . PROSTATE BIOPSY N/A 09/28/2017   Procedure: BIOPSY TRANSRECTAL ULTRASONIC PROSTATE (TUBP);  Surgeon: Brett Seal, MD;  Location: [facility];  Service: Urology;  Laterality: N/A;  . PROSTATECTOMY    . ROBOT ASSISTED LAPAROSCOPIC RADICAL PROSTATECTOMY N/A 12/06/2017   Procedure: XI ROBOTIC ASSISTED LAPAROSCOPIC RADICAL PROSTATECTOMY LEVEL 2;  Surgeon: Brett Bring, MD;  Location: [facility];  Service: Urology;  Laterality: N/A;  . TOOTH EXTRACTION  1999    Social History:  reports that he has never smoked. He has never used smokeless tobacco. He reports that he does not drink alcohol or use drugs.  Allergies: No Known Allergies  Medications Prior to Admission  Medication Sig Dispense Refill  . atorvastatin (LIPITOR) 10 MG tablet Take 10 mg by mouth every evening.     . metFORMIN (GLUCOPHAGE) 500 MG tablet Take 500 mg by mouth 2 (two) times daily with a meal.       Prior to Admission medications   Medication Sig Start Date End Date Taking? Authorizing Provider  atorvastatin (LIPITOR) 10 MG tablet Take 10 mg by mouth every evening.    Yes [provider]  metFORMIN (GLUCOPHAGE)  500 MG tablet Take 500 mg by mouth 2 (two) times daily with a meal.    Yes [provider]    Blood pressure 117/72, pulse 79, temperature 98.5 F (36.9 C), resp. rate 20, height '5\' 7"'  (1.702 m), weight 104.3 kg, SpO2 93 %. Physical Exam: General: pleasant, WD/WN white male who is laying in bed in NAD HEENT: head is normocephalic, atraumatic.  Sclera are noninjected.  Pupils equal  and round.  Ears and nose without any masses or lesions.  Mouth is pink and moist. Dentition fair Heart: regular, rate, and rhythm.  No obvious murmurs, gallops, or rubs noted.  Palpable pedal pulses bilaterally Lungs: CTAB, no wheezes, rhonchi, or rales noted.  Respiratory effort nonlabored Abd: multiple well healed lap incisions, soft, mild distension, mild subjective lower abdominal TTP, few BS heard, no masses, hernias, or organomegaly MS: all 4 extremities are symmetrical with no cyanosis, clubbing, or edema. Skin: warm and dry with no masses, lesions, or rashes Psych: A&Ox3 with an appropriate affect. Neuro: cranial nerves grossly intact, extremity CSM intact bilaterally, normal speech  Results for orders placed or performed during the hospital encounter of 12/19/17 (from the past 48 hour(s))  POC CBG, ED     Status: Abnormal   Collection Time: 12/19/17 11:01 AM  Result Value Ref Range   Glucose-Capillary 156 (H) 70 - 99 mg/dL  Lipase, blood     Status: None   Collection Time: 12/19/17 11:26 AM  Result Value Ref Range   Lipase 34 11 - 51 U/L    Comment: Performed at [facility], [address], [place], [place] [address]  Comprehensive metabolic panel     Status: Abnormal   Collection Time: 12/19/17 11:26 AM  Result Value Ref Range   Sodium 137 135 - 145 mmol/L   Potassium 3.9 3.5 - 5.1 mmol/L   Chloride 101 98 - 111 mmol/L   CO2 23 22 - 32 mmol/L   Glucose, Bld 167 (H) 70 - 99 mg/dL   BUN 9 8 - 23 mg/dL   Creatinine, Ser 0.89 0.61 - 1.24 mg/dL   Calcium 9.4 8.9 - 10.3 mg/dL   Total Protein 8.0 6.5 - 8.1 g/dL   Albumin 4.0 3.5 - 5.0 g/dL   AST 25 15 - 41 U/L   ALT 22 0 - 44 U/L   Alkaline Phosphatase 77 38 - 126 U/L   Total Bilirubin 1.4 (H) 0.3 - 1.2 mg/dL   GFR calc non Af Amer >60 >60 mL/min   GFR calc Af Amer >60 >60 mL/min    Comment: (NOTE) The eGFR has been calculated using the CKD EPI equation. This calculation has not been validated in all clinical  situations. eGFR's persistently <60 mL/min signify possible Chronic Kidney Disease.    Anion gap 13 5 - 15    Comment: Performed at [facility], [address], [place], [place] [address]  CBC     Status: Abnormal   Collection Time: 12/19/17 11:26 AM  Result Value Ref Range   WBC 20.3 (H) 4.0 - 10.5 K/uL   RBC 5.19 4.22 - 5.81 MIL/uL   Hemoglobin 16.7 13.0 - 17.0 g/dL   HCT 47.3 39.0 - 52.0 %   MCV 91.1 78.0 - 100.0 fL   MCH 32.2 26.0 - 34.0 pg   MCHC 35.3 30.0 - 36.0 g/dL   RDW 13.6 11.5 - 15.5 %   Platelets 280 150 - 400 K/uL    Comment: Performed at [facility],  [address] [place], [place] [address]  Urinalysis, Routine w reflex microscopic     Status: Abnormal   Collection Time: 12/19/17  6:49 PM  Result Value Ref Range   Color, Urine YELLOW YELLOW   APPearance CLEAR CLEAR   Specific Gravity, Urine 1.042 (H) 1.005 - 1.030   pH 6.0 5.0 - 8.0   Glucose, UA NEGATIVE NEGATIVE mg/dL   Hgb urine dipstick NEGATIVE NEGATIVE   Bilirubin Urine NEGATIVE NEGATIVE   Ketones, ur 20 (A) NEGATIVE mg/dL   Protein, ur NEGATIVE NEGATIVE mg/dL   Nitrite NEGATIVE NEGATIVE   Leukocytes, UA NEGATIVE NEGATIVE    Comment: Performed at [facility], [address] [place], [place] [address]  Glucose, capillary     Status: Abnormal   Collection Time: 12/19/17  8:12 PM  Result Value Ref Range   Glucose-Capillary 139 (H) 70 - 99 mg/dL  CBC     Status: Abnormal   Collection Time: 12/19/17  8:44 PM  Result Value Ref Range   WBC 15.7 (H) 4.0 - 10.5 K/uL   RBC 4.90 4.22 - 5.81 MIL/uL   Hemoglobin 15.8 13.0 - 17.0 g/dL   HCT 45.9 39.0 - 52.0 %   MCV 93.7 78.0 - 100.0 fL   MCH 32.2 26.0 - 34.0 pg   MCHC 34.4 30.0 - 36.0 g/dL   RDW 14.0 11.5 - 15.5 %   Platelets 288 150 - 400 K/uL    Comment: Performed at [facility], [place] [address], [place], [place] [address]  Creatinine, serum     Status: None   Collection Time: 12/19/17  8:44 PM  Result Value Ref Range   Creatinine,  Ser 0.89 0.61 - 1.24 mg/dL   GFR calc non Af Amer >60 >60 mL/min   GFR calc Af Amer >60 >60 mL/min    Comment: (NOTE) The eGFR has been calculated using the CKD EPI equation. This calculation has not been validated in all clinical situations. eGFR's persistently <60 mL/min signify possible Chronic Kidney Disease. Performed at [facility], [place] [address], [place], [place] [address]   Glucose, capillary     Status: Abnormal   Collection Time: 12/19/17 11:52 PM  Result Value Ref Range   Glucose-Capillary 143 (H) 70 - 99 mg/dL  Glucose, capillary     Status: Abnormal   Collection Time: 12/20/17  4:28 AM  Result Value Ref Range   Glucose-Capillary 162 (H) 70 - 99 mg/dL  CBC     Status: Abnormal   Collection Time: 12/20/17  5:11 AM  Result Value Ref Range   WBC 11.2 (H) 4.0 - 10.5 K/uL   RBC 4.78 4.22 - 5.81 MIL/uL   Hemoglobin 14.9 13.0 - 17.0 g/dL   HCT 43.7 39.0 - 52.0 %   MCV 91.4 78.0 - 100.0 fL   MCH 31.2 26.0 - 34.0 pg   MCHC 34.1 30.0 - 36.0 g/dL   RDW 13.7 11.5 - 15.5 %   Platelets 310 150 - 400 K/uL    Comment: Performed at [facility], [place] [address], [place], [place] [address]  Comprehensive metabolic panel     Status: Abnormal   Collection Time: 12/20/17  5:11 AM  Result Value Ref Range   Sodium 141 135 - 145 mmol/L   Potassium 4.4 3.5 - 5.1 mmol/L   Chloride 105 98 - 111 mmol/L   CO2 27 22 - 32 mmol/L   Glucose, Bld 189 (H) 70 - 99 mg/dL  BUN 11 8 - 23 mg/dL   Creatinine, Ser 0.89 0.61 - 1.24 mg/dL   Calcium 9.1 8.9 - 10.3 mg/dL   Total Protein 7.2 6.5 - 8.1 g/dL   Albumin 3.3 (L) 3.5 - 5.0 g/dL   AST 19 15 - 41 U/L   ALT 17 0 - 44 U/L   Alkaline Phosphatase 62 38 - 126 U/L   Total Bilirubin 0.9 0.3 - 1.2 mg/dL   GFR calc non Af Amer >60 >60 mL/min   GFR calc Af Amer >60 >60 mL/min    Comment: (NOTE) The eGFR has been calculated using the CKD EPI equation. This calculation has not been validated in all clinical  situations. eGFR's persistently <60 mL/min signify possible Chronic Kidney Disease.    Anion gap 9 5 - 15    Comment: Performed at [facility], [place] [address], [place], [place] [address]  Glucose, capillary     Status: Abnormal   Collection Time: 12/20/17  7:43 AM  Result Value Ref Range   Glucose-Capillary 172 (H) 70 - 99 mg/dL   Ct Abdomen Pelvis W Contrast  Result Date: 12/19/2017 CLINICAL DATA:  Vomiting and abdominal pain. EXAM: CT ABDOMEN AND PELVIS WITH CONTRAST TECHNIQUE: Multidetector CT imaging of the abdomen and pelvis was performed using the standard protocol following bolus administration of intravenous contrast. CONTRAST:  <See Chart> ISOVUE-300 IOPAMIDOL (ISOVUE-300) INJECTION 61%, 19m ISOVUE-300 IOPAMIDOL (ISOVUE-300) INJECTION 61%, 1036mISOVUE-300 IOPAMIDOL (ISOVUE-300) INJECTION 61% COMPARISON:  09/20/2017. FINDINGS: Lower chest: Lung bases show no acute findings. Heart is at the upper limits of normal in size. No pericardial or pleural effusion. Prepericardiac lymph nodes are subcentimeter in short axis size. Distal esophagus is dilated and fluid-filled. Hepatobiliary: Liver is slightly decreased in attenuation diffusely. Liver and gallbladder are otherwise unremarkable. No biliary ductal dilatation. Pancreas: Negative. Spleen: Negative. Adrenals/Urinary Tract: Adrenal glands are unremarkable. Tiny stone in the right kidney. Kidneys are otherwise unremarkable. Ureters are decompressed. Bladder is low in volume. Stomach/Bowel: Stomach is dilated and fluid-filled. Duodenum and proximal jejunum are decompressed. There is dilated and fluid-filled distal jejunum and ileum, to the level of an apparent small bowel stricture in the right lower quadrant (series 2, image 60), measuring approximately 6.7 cm. Remainder of the small bowel is decompressed. Appendix and colon are unremarkable. Vascular/Lymphatic: Atherosclerotic calcification of the arterial vasculature without  abdominal aortic aneurysm. Retroperitoneal lymph nodes are not enlarged by CT size criteria. Circumaortic left renal vein. Reproductive: Prostatectomy. Other: Mild presacral edema. Trace interloop fluid. Mesenteries and peritoneum are otherwise unremarkable. No free air. Musculoskeletal: Degenerative changes in the spine. No worrisome lytic or sclerotic lesions. IMPRESSION: 1. 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 2. Hepatic steatosis. 3. Tiny right renal stone. 4.  Aortic atherosclerosis (ICD10-170.0). Electronically Signed   By: MeLorin Picket.D.   On: 12/19/2017 14:18   Dg Chest Portable 1 View  Result Date: 12/19/2017 CLINICAL DATA:  Nasogastric tube placement EXAM: PORTABLE CHEST 1 VIEW COMPARISON:  None. FINDINGS: The side port of the nasogastric tube projects over the stomach. There is shallow lung inflation with bibasilar atelectasis. No pleural effusion or pneumothorax. Normal cardiomediastinal contours. IMPRESSION: Nasogastric tube side port projects over the stomach. Electronically Signed   By: KeUlyses Jarred.D.   On: 12/19/2017 17:26   Anti-infectives (From admission, onward)   Start     Dose/Rate Route Frequency Ordered Stop   12/19/17 2200  metroNIDAZOLE (FLAGYL) IVPB 500 mg  500 mg 100 mL/hr over 60 Minutes Intravenous Every 8 hours 12/19/17 1943     12/19/17 2000  ciprofloxacin (CIPRO) IVPB 400 mg     400 mg 200 mL/hr over 60 Minutes Intravenous Every 12 hours 12/19/17 1943         Assessment/Plan DM HLD S/p Robotic assisted laparoscopic radical prostatectomy 12/06/17 by Dr. Alinda Trevino  ?Crohns/IBD SBO - In regards to the SBO, would recommend continuing bowel rest, NG tube decompression, and IVF resuscitation. He is now passing flatus so hopefully the obstruction is improving. Will start small bowel protocol with gastrograffin study today. Due to question of Crohns, recommend GI consult for assistance with  management.  ID - cipro/flagyl 9/18>> VTE - SCDs, lovenox FEN - IVF, NPO/NGT Foley - none   [place], [facility] Surgery 12/20/2017, 8:46 AM Pager: [phone] Consults: [phone] Mon 7:00 am -11:30 AM Tues-Fri 7:00 am-4:30 pm Sat-Sun 7:00 am-11:30 am

## 2017-12-21 DIAGNOSIS — K50012 Crohn's disease of small intestine with intestinal obstruction: Secondary | ICD-10-CM

## 2017-12-21 DIAGNOSIS — K529 Noninfective gastroenteritis and colitis, unspecified: Secondary | ICD-10-CM

## 2017-12-21 LAB — GLUCOSE, CAPILLARY
GLUCOSE-CAPILLARY: 110 mg/dL — AB (ref 70–99)
GLUCOSE-CAPILLARY: 123 mg/dL — AB (ref 70–99)
Glucose-Capillary: 114 mg/dL — ABNORMAL HIGH (ref 70–99)
Glucose-Capillary: 229 mg/dL — ABNORMAL HIGH (ref 70–99)
Glucose-Capillary: 167 mg/dL — ABNORMAL HIGH (ref 70–99)
Glucose-Capillary: 165 mg/dL — ABNORMAL HIGH (ref 70–99)

## 2017-12-21 NOTE — Progress Notes (Addendum)
Patient ID: Brett Trevino, male   DOB: 11-16-1955, 62 y.o.   MRN: [id number]    Progress Note   Subjective  Day # 2 admit  Walked a lot last pm and this am- had bm's last night and today Feels much better  NG out earlier this am   Films show contrast has passed in to Colon, still dilated small bowel loops     Objective   Vital signs in last 24 hours: Temp:  [98 F (36.7 C)-98.7 F (37.1 C)] 98 F (36.7 C) (09/20 0432) Pulse Rate:  [68-83] 68 (09/20 0432) Resp:  [18] 18 (09/20 0432) BP: (115-132)/(70-71) 132/71 (09/20 0432) SpO2:  [94 %-96 %] 96 % (09/20 0432) Last BM Date: 12/19/17 General:    white male  in NAD Heart:  Regular rate and rhythm; no murmurs Lungs: Respirations even and unlabored, lungs CTA bilaterally Abdomen:  Soft, minimally tender RLQ,and nondistended. BS present Extremities:  Without edema. Neurologic:  Alert and oriented,  grossly normal neurologically. Psych:  Cooperative. Normal mood and affect.  Intake/Output from previous day: 09/19 0701 - 09/20 0700 In: 300 [IV Piggyback:300] Out: 700 [Emesis/NG output:700] Intake/Output this shift: Total I/O In: -  Out: 500 [Emesis/NG output:500]  Lab Results: Recent Labs    12/19/17 1126 12/19/17 2044 12/20/17 0511  WBC 20.3* 15.7* 11.2*  HGB 16.7 15.8 14.9  HCT 47.3 45.9 43.7  PLT 280 288 310   BMET Recent Labs    12/19/17 1126 12/19/17 2044 12/20/17 0511  NA 137  --  141  K 3.9  --  4.4  CL 101  --  105  CO2 23  --  27  GLUCOSE 167*  --  189*  BUN 9  --  11  CREATININE 0.89 0.89 0.89  CALCIUM 9.4  --  9.1   LFT Recent Labs    12/20/17 0511  PROT 7.2  ALBUMIN 3.3*  AST 19  ALT 17  ALKPHOS 62  BILITOT 0.9   PT/INR No results for input(s): LABPROT, INR in the last 72 hours.  Studies/Results: Ct Abdomen Pelvis W Contrast  Result Date: 12/19/2017 CLINICAL DATA:  Vomiting and abdominal pain. EXAM: CT ABDOMEN AND PELVIS WITH CONTRAST TECHNIQUE: Multidetector CT imaging of the  abdomen and pelvis was performed using the standard protocol following bolus administration of intravenous contrast. CONTRAST:  <See Chart> ISOVUE-300 IOPAMIDOL (ISOVUE-300) INJECTION 61%, 44mL ISOVUE-300 IOPAMIDOL (ISOVUE-300) INJECTION 61%, 111mL ISOVUE-300 IOPAMIDOL (ISOVUE-300) INJECTION 61% COMPARISON:  09/20/2017. FINDINGS: Lower chest: Lung bases show no acute findings. Heart is at the upper limits of normal in size. No pericardial or pleural effusion. Prepericardiac lymph nodes are subcentimeter in short axis size. Distal esophagus is dilated and fluid-filled. Hepatobiliary: Liver is slightly decreased in attenuation diffusely. Liver and gallbladder are otherwise unremarkable. No biliary ductal dilatation. Pancreas: Negative. Spleen: Negative. Adrenals/Urinary Tract: Adrenal glands are unremarkable. Tiny stone in the right kidney. Kidneys are otherwise unremarkable. Ureters are decompressed. Bladder is low in volume. Stomach/Bowel: Stomach is dilated and fluid-filled. Duodenum and proximal jejunum are decompressed. There is dilated and fluid-filled distal jejunum and ileum, to the level of an apparent small bowel stricture in the right lower quadrant (series 2, image 60), measuring approximately 6.7 cm. Remainder of the small bowel is decompressed. Appendix and colon are unremarkable. Vascular/Lymphatic: Atherosclerotic calcification of the arterial vasculature without abdominal aortic aneurysm. Retroperitoneal lymph nodes are not enlarged by CT size criteria. Circumaortic left renal vein. Reproductive: Prostatectomy. Other: Mild presacral edema. Trace interloop fluid. Mesenteries  and peritoneum are otherwise unremarkable. No free air. Musculoskeletal: Degenerative changes in the spine. No worrisome lytic or sclerotic lesions. IMPRESSION: 1. 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 2. Hepatic steatosis. 3. Tiny right renal stone. 4.  Aortic  atherosclerosis (ICD10-170.0). Electronically Signed   By: Lorin Picket M.D.   On: 12/19/2017 14:18   Dg Chest Portable 1 View  Result Date: 12/19/2017 CLINICAL DATA:  Nasogastric tube placement EXAM: PORTABLE CHEST 1 VIEW COMPARISON:  None. FINDINGS: The side port of the nasogastric tube projects over the stomach. There is shallow lung inflation with bibasilar atelectasis. No pleural effusion or pneumothorax. Normal cardiomediastinal contours. IMPRESSION: Nasogastric tube side port projects over the stomach. Electronically Signed   By: Ulyses Jarred M.D.   On: 12/19/2017 17:26   Dg Abd Portable 1v-small Bowel Obstruction Protocol-initial, 8 Hr Delay  Result Date: 12/20/2017 CLINICAL DATA:  8 hour small bowel protocol EXAM: PORTABLE ABDOMEN - 1 VIEW COMPARISON:  CT 12/19/2017 FINDINGS: Dilated central small bowel loops measuring up to 4.1 cm. Radiopaque contrast present within the colon. No abnormal calcification. IMPRESSION: Dilated central small bowel loops but with contrast present in the colon. Electronically Signed   By: Donavan Foil M.D.   On: 12/20/2017 19:30       Assessment / Plan:     #1 62 yo WM with resolving SBO, recurrent with CT showing distal ileal stricture  Most C/W Crohns Prior IBD markers also + ASCA  NG out starting clears to full today   Steroids helping  #2 AODM #3 hyperlipidemia #4 Family hx Colon Ca  Plan; Ambulate, Advance diet clear to full - do not start solids Continue Solumedrol 60 daily  Antibiotics stopped  Possible bowel prep tomorrow , Colon Sunday depending on course       Contact  Amy Esterwood, P.A.-C               [phone]      Active Problems:   Prostate cancer ([place])   SBO (small bowel obstruction) ([facility])   Vomiting   Type 2 diabetes mellitus with hyperlipidemia ([place])     LOS: 2 days   Amy Esterwood  12/21/2017, 10:36 AM   Agree with Ms. Genia Harold assessment and plan. Gatha Mayer, MD, [facility]   Gatha Mayer, MD, [facility] 12/21/2017 7:10 PM Pager [phone]

## 2017-12-21 NOTE — Progress Notes (Signed)
[facility] Progress Note     Subjective: CC:  Denies pain. +flatus. +BM , non-bloody.   Objective: Vital signs in last 24 hours: Temp:  [98 F (36.7 C)-98.7 F (37.1 C)] 98 F (36.7 C) (09/20 0432) Pulse Rate:  [68-83] 68 (09/20 0432) Resp:  [18] 18 (09/20 0432) BP: (115-132)/(70-71) 132/71 (09/20 0432) SpO2:  [94 %-96 %] 96 % (09/20 0432) Last BM Date: 12/19/17  Intake/Output from previous day: 09/19 0701 - 09/20 0700 In: 300 [IV Piggyback:300] Out: 700 [Emesis/NG output:700] Intake/Output this shift: Total I/O In: -  Out: 500 [Emesis/NG output:500]  PE: Gen:  Alert, NAD, pleasant Card:  Regular rate and rhythm, pedal pulses 2+ BL Pulm:  Normal effort, clear to auscultation bilaterally Abd: Soft, mild tenderness, non-distended, bowel sounds present, incisions c/d/i w/ skin glue no surrounding erythema or drainage  Skin: warm and dry, flat < 1cm pink rash ankles/groin - some lesions are circumscribed Psych: A&Ox3   Lab Results:  Recent Labs    12/19/17 2044 12/20/17 0511  WBC 15.7* 11.2*  HGB 15.8 14.9  HCT 45.9 43.7  PLT 288 310   BMET Recent Labs    12/19/17 1126 12/19/17 2044 12/20/17 0511  NA 137  --  141  K 3.9  --  4.4  CL 101  --  105  CO2 23  --  27  GLUCOSE 167*  --  189*  BUN 9  --  11  CREATININE 0.89 0.89 0.89  CALCIUM 9.4  --  9.1   PT/INR No results for input(s): LABPROT, INR in the last 72 hours. CMP     Component Value Date/Time   NA 141 12/20/2017 0511   K 4.4 12/20/2017 0511   CL 105 12/20/2017 0511   CO2 27 12/20/2017 0511   GLUCOSE 189 (H) 12/20/2017 0511   BUN 11 12/20/2017 0511   CREATININE 0.89 12/20/2017 0511   CALCIUM 9.1 12/20/2017 0511   PROT 7.2 12/20/2017 0511   ALBUMIN 3.3 (L) 12/20/2017 0511   AST 19 12/20/2017 0511   ALT 17 12/20/2017 0511   ALKPHOS 62 12/20/2017 0511   BILITOT 0.9 12/20/2017 0511   GFRNONAA >60 12/20/2017 0511   GFRAA >60 12/20/2017 0511   Lipase     Component Value  Date/Time   LIPASE 34 12/19/2017 1126       Studies/Results: Ct Abdomen Pelvis W Contrast  Result Date: 12/19/2017 CLINICAL DATA:  Vomiting and abdominal pain. EXAM: CT ABDOMEN AND PELVIS WITH CONTRAST TECHNIQUE: Multidetector CT imaging of the abdomen and pelvis was performed using the standard protocol following bolus administration of intravenous contrast. CONTRAST:  <See Chart> ISOVUE-300 IOPAMIDOL (ISOVUE-300) INJECTION 61%, 41mL ISOVUE-300 IOPAMIDOL (ISOVUE-300) INJECTION 61%, 167mL ISOVUE-300 IOPAMIDOL (ISOVUE-300) INJECTION 61% COMPARISON:  09/20/2017. FINDINGS: Lower chest: Lung bases show no acute findings. Heart is at the upper limits of normal in size. No pericardial or pleural effusion. Prepericardiac lymph nodes are subcentimeter in short axis size. Distal esophagus is dilated and fluid-filled. Hepatobiliary: Liver is slightly decreased in attenuation diffusely. Liver and gallbladder are otherwise unremarkable. No biliary ductal dilatation. Pancreas: Negative. Spleen: Negative. Adrenals/Urinary Tract: Adrenal glands are unremarkable. Tiny stone in the right kidney. Kidneys are otherwise unremarkable. Ureters are decompressed. Bladder is low in volume. Stomach/Bowel: Stomach is dilated and fluid-filled. Duodenum and proximal jejunum are decompressed. There is dilated and fluid-filled distal jejunum and ileum, to the level of an apparent small bowel stricture in the right lower quadrant (series 2, image 60), measuring approximately  6.7 cm. Remainder of the small bowel is decompressed. Appendix and colon are unremarkable. Vascular/Lymphatic: Atherosclerotic calcification of the arterial vasculature without abdominal aortic aneurysm. Retroperitoneal lymph nodes are not enlarged by CT size criteria. Circumaortic left renal vein. Reproductive: Prostatectomy. Other: Mild presacral edema. Trace interloop fluid. Mesenteries and peritoneum are otherwise unremarkable. No free air. Musculoskeletal:  Degenerative changes in the spine. No worrisome lytic or sclerotic lesions. IMPRESSION: 1. 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 2. Hepatic steatosis. 3. Tiny right renal stone. 4.  Aortic atherosclerosis (ICD10-170.0). Electronically Signed   By: Lorin Picket M.D.   On: 12/19/2017 14:18   Dg Chest Portable 1 View  Result Date: 12/19/2017 CLINICAL DATA:  Nasogastric tube placement EXAM: PORTABLE CHEST 1 VIEW COMPARISON:  None. FINDINGS: The side port of the nasogastric tube projects over the stomach. There is shallow lung inflation with bibasilar atelectasis. No pleural effusion or pneumothorax. Normal cardiomediastinal contours. IMPRESSION: Nasogastric tube side port projects over the stomach. Electronically Signed   By: Ulyses Jarred M.D.   On: 12/19/2017 17:26   Dg Abd Portable 1v-small Bowel Obstruction Protocol-initial, 8 Hr Delay  Result Date: 12/20/2017 CLINICAL DATA:  8 hour small bowel protocol EXAM: PORTABLE ABDOMEN - 1 VIEW COMPARISON:  CT 12/19/2017 FINDINGS: Dilated central small bowel loops measuring up to 4.1 cm. Radiopaque contrast present within the colon. No abnormal calcification. IMPRESSION: Dilated central small bowel loops but with contrast present in the colon. Electronically Signed   By: Donavan Foil M.D.   On: 12/20/2017 19:30    Anti-infectives: Anti-infectives (From admission, onward)   Start     Dose/Rate Route Frequency Ordered Stop   12/19/17 2200  metroNIDAZOLE (FLAGYL) IVPB 500 mg     500 mg 100 mL/hr over 60 Minutes Intravenous Every 8 hours 12/19/17 1943     12/19/17 2000  ciprofloxacin (CIPRO) IVPB 400 mg     400 mg 200 mL/hr over 60 Minutes Intravenous Every 12 hours 12/19/17 1943       Assessment/Plan DM HLD S/p robotic assisted laparoscopic radical prostatectomy   PSBO, suspect 2/2 ileal stricture, possible Crohns - SB protocol shows contrast in the colon, +BMs and flatus  - D/C  NG tube and start clears, advance to full liquids as tolerated - would not advance to soft diet today, would see how patient tolerates liquids first - hopefully patient will respond will to GI's recommended therapy and stricture will improve  - OOB/mobilize     LOS: 2 days    Obie Dredge, [facility] Pager: [phone]

## 2017-12-21 NOTE — Progress Notes (Signed)
PROGRESS NOTE    Brett Trevino  TMH:[id number] DOB: [birth year] DOA: 12/19/2017 PCP: Curlene Labrum, MD   Brief Narrative:  62 year old male with a history of type 2 diabetes, prostate cancer status post robotic assisted laparoscopic radical prostatectomy on 9/5 by Dr. Noralee Chars, hyperlipidemia came to the hospital with complaints of abdominal pain nausea and vomiting.  Patient had this off and on for last several months and has previously been evaluated by GI and the work-up was negative.  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  Case was discussed by Dr. Arnoldo Morale, Dr. Alyson Ingles with [place] [facility] here at [facility] who recommended transferring the patient here for further management.  NG tube was placed which made it feel a whole lot better.  He was also empirically started on steroids and antibiotics.  Surgery and gastroenterology were consulted.   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  We will slowly advance it to soft diet tomorrow. - Monitor urine output. - 08/28/2017 ASCA levels elevated at 36 concerning for Crohn's disease which is likely culprit for the stricture.  Continue IV steroids and will transition to oral with GI recommendations. -I will go ahead and discontinue antibiotics unless if gastroenterology feels necessary was restarted back on it. -Appreciate gastroenterology and general surgery input.  Prostate cancer status post laparoscopic radical robotic prostatectomy 9/5 - Per urology he is passing urine and stable from their  perspective.  Continue to provide supportive care.  Diabetes mellitus type 2 - Accu-Cheks and sliding scale.  Metformin is on hold. -Plans on increasing pre-meal insulin to 5 units 3 times daily  Hyperlipidemia - Resume statin when appropriate  DVT prophylaxis: Lovenox Code Status: Full code Family Communication: Wife at bedside Disposition Plan: Maintain inpatient stay until his p.o. intake is improved and ensure he is able to tolerated  Consultants:   General surgery  Gastroenterology  Procedures:   None  Antimicrobials:   Cipro and Flagyl 9/18 >9/19   Subjective: States she was able to have a large bowel movement and is passing flatulence.  His abdominal symptoms have significantly improved.  No complaints.  Review of Systems Otherwise negative except as per HPI, including: General = no fevers, chills, dizziness, malaise, fatigue HEENT/EYES = negative for pain, redness, loss of vision, double vision, blurred vision, loss of hearing, sore throat, hoarseness, dysphagia Cardiovascular= negative for chest pain, palpitation, murmurs, lower extremity swelling Respiratory/lungs= negative for shortness of breath, cough, hemoptysis, wheezing, mucus production Gastrointestinal= negative for nausea, vomiting,, abdominal pain, melena, hematemesis Genitourinary= negative for Dysuria, Hematuria, Change in Urinary Frequency MSK = Negative for arthralgia, myalgias, Back Pain, Joint swelling  Neurology= Negative for headache, seizures, numbness, tingling  Psychiatry= Negative for anxiety, depression, suicidal and homocidal ideation Allergy/Immunology= Medication/Food allergy as listed  Skin= Negative for Rash, lesions, ulcers, itching    Objective: Vitals:   12/20/17 0431 12/20/17 1353 12/20/17 1942 12/21/17 0432  BP: 117/72 115/71 126/70 132/71  Pulse: 79 72 83 68  Resp: 20 18  18   Temp: 98.5 F (36.9 C) 98.7 F (37.1 C) 98.4 F (  36.9 C) 98 F (36.7 C)  TempSrc:  Oral  Oral Oral  SpO2: 93% 94% 95% 96%  Weight:      Height:        Intake/Output Summary (Last 24 hours) at 12/21/2017 1041 Last data filed at 12/21/2017 0914 Gross per 24 hour  Intake 300 ml  Output 1200 ml  Net -900 ml   Filed Weights   12/19/17 1043 12/19/17 1944  Weight: 106.6 kg 104.3 kg    Examination:  Constitutional: NAD, calm, comfortable; NG tube in place which will be removed shortly Eyes: PERRL, lids and conjunctivae normal ENMT: Mucous membranes are moist. Posterior pharynx clear of any exudate or lesions.Normal dentition.  Neck: normal, supple, no masses, no thyromegaly Respiratory: clear to auscultation bilaterally, no wheezing, no crackles. Normal respiratory effort. No accessory muscle use.  Cardiovascular: Regular rate and rhythm, no murmurs / rubs / gallops. No extremity edema. 2+ pedal pulses. No carotid bruits.  Abdomen: no tenderness, no masses palpated. No hepatosplenomegaly. Bowel sounds positive.  Musculoskeletal: no clubbing / cyanosis. No joint deformity upper and lower extremities. Good ROM, no contractures. Normal muscle tone.  Skin: no rashes, lesions, ulcers. No induration Neurologic: CN 2-12 grossly intact. Sensation intact, DTR normal. Strength 5/5 in all 4.  Psychiatric: Normal judgment and insight. Alert and oriented x 3. Normal mood.    Data Reviewed:   CBC: Recent Labs  Lab 12/19/17 1126 12/19/17 2044 12/20/17 0511  WBC 20.3* 15.7* 11.2*  HGB 16.7 15.8 14.9  HCT 47.3 45.9 43.7  MCV 91.1 93.7 91.4  PLT 280 288 784   Basic Metabolic Panel: Recent Labs  Lab 12/19/17 1126 12/19/17 2044 12/20/17 0511  NA 137  --  141  K 3.9  --  4.4  CL 101  --  105  CO2 23  --  27  GLUCOSE 167*  --  189*  BUN 9  --  11  CREATININE 0.89 0.89 0.89  CALCIUM 9.4  --  9.1   GFR: Estimated Creatinine Clearance: 96.5 mL/min (by C-G formula based on SCr of 0.89 mg/dL). Liver Function Tests: Recent Labs  Lab 12/19/17 1126 12/20/17 0511  AST 25 19    ALT 22 17  ALKPHOS 77 62  BILITOT 1.4* 0.9  PROT 8.0 7.2  ALBUMIN 4.0 3.3*   Recent Labs  Lab 12/19/17 1126  LIPASE 34   No results for input(s): AMMONIA in the last 168 hours. Coagulation Profile: No results for input(s): INR, PROTIME in the last 168 hours. Cardiac Enzymes: No results for input(s): CKTOTAL, CKMB, CKMBINDEX, TROPONINI in the last 168 hours. BNP (last 3 results) No results for input(s): PROBNP in the last 8760 hours. HbA1C: No results for input(s): HGBA1C in the last 72 hours. CBG: Recent Labs  Lab 12/20/17 1554 12/20/17 2019 12/21/17 0033 12/21/17 0430 12/21/17 0719  GLUCAP 170* 132* 110* 123* 114*   Lipid Profile: No results for input(s): CHOL, HDL, LDLCALC, TRIG, CHOLHDL, LDLDIRECT in the last 72 hours. Thyroid Function Tests: No results for input(s): TSH, T4TOTAL, FREET4, T3FREE, THYROIDAB in the last 72 hours. Anemia Panel: No results for input(s): VITAMINB12, FOLATE, FERRITIN, TIBC, IRON, RETICCTPCT in the last 72 hours. Sepsis Labs: No results for input(s): PROCALCITON, LATICACIDVEN in the last 168 hours.  No results found for this or any previous visit (from the past 240 hour(s)).       Radiology Studies: Ct Abdomen Pelvis W Contrast  Result Date: 12/19/2017 CLINICAL DATA:  Vomiting and abdominal pain. EXAM:  CT ABDOMEN AND PELVIS WITH CONTRAST TECHNIQUE: Multidetector CT imaging of the abdomen and pelvis was performed using the standard protocol following bolus administration of intravenous contrast. CONTRAST:  <See Chart> ISOVUE-300 IOPAMIDOL (ISOVUE-300) INJECTION 61%, 48mL ISOVUE-300 IOPAMIDOL (ISOVUE-300) INJECTION 61%, 166mL ISOVUE-300 IOPAMIDOL (ISOVUE-300) INJECTION 61% COMPARISON:  09/20/2017. FINDINGS: Lower chest: Lung bases show no acute findings. Heart is at the upper limits of normal in size. No pericardial or pleural effusion. Prepericardiac lymph nodes are subcentimeter in short axis size. Distal esophagus is dilated and  fluid-filled. Hepatobiliary: Liver is slightly decreased in attenuation diffusely. Liver and gallbladder are otherwise unremarkable. No biliary ductal dilatation. Pancreas: Negative. Spleen: Negative. Adrenals/Urinary Tract: Adrenal glands are unremarkable. Tiny stone in the right kidney. Kidneys are otherwise unremarkable. Ureters are decompressed. Bladder is low in volume. Stomach/Bowel: Stomach is dilated and fluid-filled. Duodenum and proximal jejunum are decompressed. There is dilated and fluid-filled distal jejunum and ileum, to the level of an apparent small bowel stricture in the right lower quadrant (series 2, image 60), measuring approximately 6.7 cm. Remainder of the small bowel is decompressed. Appendix and colon are unremarkable. Vascular/Lymphatic: Atherosclerotic calcification of the arterial vasculature without abdominal aortic aneurysm. Retroperitoneal lymph nodes are not enlarged by CT size criteria. Circumaortic left renal vein. Reproductive: Prostatectomy. Other: Mild presacral edema. Trace interloop fluid. Mesenteries and peritoneum are otherwise unremarkable. No free air. Musculoskeletal: Degenerative changes in the spine. No worrisome lytic or sclerotic lesions. IMPRESSION: 1. 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 2. Hepatic steatosis. 3. Tiny right renal stone. 4.  Aortic atherosclerosis (ICD10-170.0). Electronically Signed   By: Lorin Picket M.D.   On: 12/19/2017 14:18   Dg Chest Portable 1 View  Result Date: 12/19/2017 CLINICAL DATA:  Nasogastric tube placement EXAM: PORTABLE CHEST 1 VIEW COMPARISON:  None. FINDINGS: The side port of the nasogastric tube projects over the stomach. There is shallow lung inflation with bibasilar atelectasis. No pleural effusion or pneumothorax. Normal cardiomediastinal contours. IMPRESSION: Nasogastric tube side port projects over the stomach. Electronically Signed   By: Ulyses Jarred  M.D.   On: 12/19/2017 17:26   Dg Abd Portable 1v-small Bowel Obstruction Protocol-initial, 8 Hr Delay  Result Date: 12/20/2017 CLINICAL DATA:  8 hour small bowel protocol EXAM: PORTABLE ABDOMEN - 1 VIEW COMPARISON:  CT 12/19/2017 FINDINGS: Dilated central small bowel loops measuring up to 4.1 cm. Radiopaque contrast present within the colon. No abnormal calcification. IMPRESSION: Dilated central small bowel loops but with contrast present in the colon. Electronically Signed   By: Donavan Foil M.D.   On: 12/20/2017 19:30        Scheduled Meds: . enoxaparin (LOVENOX) injection  40 mg Subcutaneous Q24H  . insulin aspart  0-9 Units Subcutaneous Q4H  . methylPREDNISolone (SOLU-MEDROL) injection  60 mg Intravenous Q24H   Continuous Infusions: . ciprofloxacin 400 mg (12/21/17 0827)  . metronidazole 500 mg (12/21/17 0501)     LOS: 2 days   Time spent= 30 mins    Lindee Leason Arsenio Loader, MD [facility] Pager [phone]   If 7PM-7AM, please contact night-coverage [email or web address] Password [facility] 12/21/2017, 10:41 AM

## 2017-12-22 DIAGNOSIS — K50012 Crohn's disease of small intestine with intestinal obstruction: Principal | ICD-10-CM

## 2017-12-22 LAB — BASIC METABOLIC PANEL
Anion gap: 11 (ref 5–15)
BUN: 14 mg/dL (ref 8–23)
CHLORIDE: 104 mmol/L (ref 98–111)
CO2: 28 mmol/L (ref 22–32)
Calcium: 8.8 mg/dL — ABNORMAL LOW (ref 8.9–10.3)
Creatinine, Ser: 0.89 mg/dL (ref 0.61–1.24)
GFR calc Af Amer: >60 mL/min (ref >60)
GFR calc non Af Amer: >60 mL/min (ref >60)
Glucose, Bld: 111 mg/dL — ABNORMAL HIGH (ref 70–99)
Potassium: 3.5 mmol/L (ref 3.5–5.1)
Sodium: 143 mmol/L (ref 135–145)

## 2017-12-22 LAB — CBC
HCT: 41.5 % (ref 39.0–52.0)
Hemoglobin: 14 g/dL (ref 13.0–17.0)
MCH: 31.4 pg (ref 26.0–34.0)
MCHC: 33.7 g/dL (ref 30.0–36.0)
MCV: 93 fL (ref 78.0–100.0)
PLATELETS: 294 10*3/uL (ref 150–400)
RBC: 4.46 MIL/uL (ref 4.22–5.81)
RDW: 13.7 % (ref 11.5–15.5)
WBC: 11.7 10*3/uL — ABNORMAL HIGH (ref 4.0–10.5)

## 2017-12-22 LAB — GLUCOSE, CAPILLARY
GLUCOSE-CAPILLARY: 105 mg/dL — AB (ref 70–99)
GLUCOSE-CAPILLARY: 125 mg/dL — AB (ref 70–99)
Glucose-Capillary: 116 mg/dL — ABNORMAL HIGH (ref 70–99)
Glucose-Capillary: 167 mg/dL — ABNORMAL HIGH (ref 70–99)
Glucose-Capillary: 192 mg/dL — ABNORMAL HIGH (ref 70–99)
Glucose-Capillary: 186 mg/dL — ABNORMAL HIGH (ref 70–99)

## 2017-12-22 LAB — MAGNESIUM: Magnesium: 2.3 mg/dL (ref 1.7–2.4)

## 2017-12-22 MED ORDER — PEG-KCL-NACL-NASULF-NA ASC-C 100 G PO SOLR
0.5000 | Freq: Once | ORAL | Status: AC
  Administered 2017-12-22: 100 g via ORAL
  Filled 2017-12-22: qty 1

## 2017-12-22 MED ORDER — ENOXAPARIN SODIUM 40 MG/0.4ML ~~LOC~~ SOLN: 40.0000 mg | SUBCUTANEOUS | Status: DC

## 2017-12-22 MED ORDER — PEG-KCL-NACL-NASULF-NA ASC-C 100 G PO SOLR: 1.0000 | Freq: Once | ORAL | Status: DC

## 2017-12-22 NOTE — H&P (View-Only) (Signed)
Patient ID: Brett Trevino, male   DOB: 04-17-1955, 62 y.o.   MRN: [id number]     Progress Note   Subjective   Feeling much better, tolerating clears without difficulty C/o come rectal disomfort with Bm's   No abdominal pain unless RLQ palpated   Objective   Vital signs in last 24 hours: Temp:  [97.6 F (36.4 C)-98.3 F (36.8 C)] 97.9 F (36.6 C) (09/21 0415) Pulse Rate:  [59-68] 59 (09/21 0415) Resp:  [16-19] 19 (09/21 0415) BP: (121-127)/(68-71) 123/71 (09/21 0415) SpO2:  [93 %-97 %] 97 % (09/21 0415) Last BM Date: 12/19/17 General:    white male  in NAD Heart:  Regular rate and rhythm; no murmurs Lungs: Respirations even and unlabored, lungs CTA bilaterally Abdomen:  Soft, tender RLQ and nondistended. Normal bowel sounds. Extremities:  Without edema. Neurologic:  Alert and oriented,  grossly normal neurologically. Psych:  Cooperative. Normal mood and affect.   Lab Results: Recent Labs    12/19/17 2044 12/20/17 0511 12/22/17 0010  WBC 15.7* 11.2* 11.7*  HGB 15.8 14.9 14.0  HCT 45.9 43.7 41.5  PLT 288 310 294   BMET Recent Labs    12/19/17 1126 12/19/17 2044 12/20/17 0511 12/22/17 0552  NA 137  --  141 143  K 3.9  --  4.4 3.5  CL 101  --  105 104  CO2 23  --  27 28  GLUCOSE 167*  --  189* 111*  BUN 9  --  11 14  CREATININE 0.89 0.89 0.89 0.89  CALCIUM 9.4  --  9.1 8.8*      Assessment / Plan:     #1 61 yo male admitted with small bowel obstruction, recurrent with previous admission in May 2019 elsewhere. Patient has improved after NG decompression, and initiation of IV steroids  CT this admission shows a definite distal ileal stricture measuring about 6.7 cm.  There is high suspicion for Crohn's disease of the distal ileum.  #2 history of prostate cancer status post lap prostatectomy and lymphadenectomy 12/06/2017  #3 adult onset diabetes mellitus #4 hyperlipidemia #5.  Family history of colon cancer in an aunt  Plan; Discussed with patient  and his wife this morning.  He favors bowel prep today and colonoscopy tomorrow morning. Will continue clear liquids today and n.p.o. after midnight.  Patient will be scheduled for colonoscopy with Dr. Carlean Purl tomorrow morning.  Continue IV Solu-Medrol 60 mg daily  Patient asking about potential discharge tomorrow after procedure.  Will await colonoscopy findings and then decide appropriate timing.   Contact  Brett Trevino, P.A.-C               [phone]     LOS: 3 days   Brett Trevino  12/22/2017, 9:48 AM   Agree with Ms. Genia Harold assessment and plan.  Much improved - colonoscopy tomorrow. Barring problems I am inclined that he can go home after the colonoscopy w/ outpt f/u   Gatha Mayer, MD, [facility] 12/22/2017 3:49 PM Pager [phone]

## 2017-12-22 NOTE — Progress Notes (Addendum)
Patient ID: Brett Trevino, male   DOB: 21-Oct-1955, 62 y.o.   MRN: [id number]     Progress Note   Subjective   Feeling much better, tolerating clears without difficulty C/o come rectal disomfort with Bm's   No abdominal pain unless RLQ palpated   Objective   Vital signs in last 24 hours: Temp:  [97.6 F (36.4 C)-98.3 F (36.8 C)] 97.9 F (36.6 C) (09/21 0415) Pulse Rate:  [59-68] 59 (09/21 0415) Resp:  [16-19] 19 (09/21 0415) BP: (121-127)/(68-71) 123/71 (09/21 0415) SpO2:  [93 %-97 %] 97 % (09/21 0415) Last BM Date: 12/19/17 General:    white male  in NAD Heart:  Regular rate and rhythm; no murmurs Lungs: Respirations even and unlabored, lungs CTA bilaterally Abdomen:  Soft, tender RLQ and nondistended. Normal bowel sounds. Extremities:  Without edema. Neurologic:  Alert and oriented,  grossly normal neurologically. Psych:  Cooperative. Normal mood and affect.   Lab Results: Recent Labs    12/19/17 2044 12/20/17 0511 12/22/17 0010  WBC 15.7* 11.2* 11.7*  HGB 15.8 14.9 14.0  HCT 45.9 43.7 41.5  PLT 288 310 294   BMET Recent Labs    12/19/17 1126 12/19/17 2044 12/20/17 0511 12/22/17 0552  NA 137  --  141 143  K 3.9  --  4.4 3.5  CL 101  --  105 104  CO2 23  --  27 28  GLUCOSE 167*  --  189* 111*  BUN 9  --  11 14  CREATININE 0.89 0.89 0.89 0.89  CALCIUM 9.4  --  9.1 8.8*      Assessment / Plan:     #1 63 yo male admitted with small bowel obstruction, recurrent with previous admission in May 2019 elsewhere. Patient has improved after NG decompression, and initiation of IV steroids  CT this admission shows a definite distal ileal stricture measuring about 6.7 cm.  There is high suspicion for Crohn's disease of the distal ileum.  #2 history of prostate cancer status post lap prostatectomy and lymphadenectomy 12/06/2017  #3 adult onset diabetes mellitus #4 hyperlipidemia #5.  Family history of colon cancer in an aunt  Plan; Discussed with patient  and his wife this morning.  He favors bowel prep today and colonoscopy tomorrow morning. Will continue clear liquids today and n.p.o. after midnight.  Patient will be scheduled for colonoscopy with Dr. Carlean Purl tomorrow morning.  Continue IV Solu-Medrol 60 mg daily  Patient asking about potential discharge tomorrow after procedure.  Will await colonoscopy findings and then decide appropriate timing.   Contact  Amy Braden, P.A.-C               [phone]     LOS: 3 days   Amy Esterwood  12/22/2017, 9:48 AM   Agree with Ms. Genia Harold assessment and plan.  Much improved - colonoscopy tomorrow. Barring problems I am inclined that he can go home after the colonoscopy w/ outpt f/u   Gatha Mayer, MD, [facility] Gastroenterology 12/22/2017 3:49 PM Pager [phone]

## 2017-12-22 NOTE — Progress Notes (Signed)
Patient ID: Brett Trevino, male   DOB: Dec 20, 1955, 62 y.o.   MRN: [id number] [facility] Progress Note:   * No surgery found *  Subjective: Mental status is clear and he appears to feel much better Objective: Vital signs in last 24 hours: Temp:  [97.6 F (36.4 C)-98.3 F (36.8 C)] 97.9 F (36.6 C) (09/21 0415) Pulse Rate:  [59-68] 59 (09/21 0415) Resp:  [16-19] 19 (09/21 0415) BP: (121-127)/(68-71) 123/71 (09/21 0415) SpO2:  [93 %-97 %] 97 % (09/21 0415)  Intake/Output from previous day: 09/20 0701 - 09/21 0700 In: 600 [P.O.:600] Out: 500 [Emesis/NG output:500] Intake/Output this shift: Total I/O In: 240 [P.O.:240] Out: -   Physical Exam: Work of breathing is OK.  NG out.    Lab Results:  Results for orders placed or performed during the hospital encounter of 12/19/17 (from the past 48 hour(s))  Glucose, capillary     Status: Abnormal   Collection Time: 12/20/17 11:44 AM  Result Value Ref Range   Glucose-Capillary 174 (H) 70 - 99 mg/dL  Glucose, capillary     Status: Abnormal   Collection Time: 12/20/17  3:54 PM  Result Value Ref Range   Glucose-Capillary 170 (H) 70 - 99 mg/dL  Glucose, capillary     Status: Abnormal   Collection Time: 12/20/17  8:19 PM  Result Value Ref Range   Glucose-Capillary 132 (H) 70 - 99 mg/dL  Glucose, capillary     Status: Abnormal   Collection Time: 12/21/17 12:33 AM  Result Value Ref Range   Glucose-Capillary 110 (H) 70 - 99 mg/dL  Glucose, capillary     Status: Abnormal   Collection Time: 12/21/17  4:30 AM  Result Value Ref Range   Glucose-Capillary 123 (H) 70 - 99 mg/dL  Glucose, capillary     Status: Abnormal   Collection Time: 12/21/17  7:19 AM  Result Value Ref Range   Glucose-Capillary 114 (H) 70 - 99 mg/dL  Glucose, capillary     Status: Abnormal   Collection Time: 12/21/17 12:19 PM  Result Value Ref Range   Glucose-Capillary 229 (H) 70 - 99 mg/dL  Glucose, capillary     Status: Abnormal   Collection Time:  12/21/17  4:27 PM  Result Value Ref Range   Glucose-Capillary 167 (H) 70 - 99 mg/dL  Glucose, capillary     Status: Abnormal   Collection Time: 12/21/17  9:09 PM  Result Value Ref Range   Glucose-Capillary 165 (H) 70 - 99 mg/dL  CBC     Status: Abnormal   Collection Time: 12/22/17 12:10 AM  Result Value Ref Range   WBC 11.7 (H) 4.0 - 10.5 K/uL   RBC 4.46 4.22 - 5.81 MIL/uL   Hemoglobin 14.0 13.0 - 17.0 g/dL   HCT 41.5 39.0 - 52.0 %   MCV 93.0 78.0 - 100.0 fL   MCH 31.4 26.0 - 34.0 pg   MCHC 33.7 30.0 - 36.0 g/dL   RDW 13.7 11.5 - 15.5 %   Platelets 294 150 - 400 K/uL    Comment: Performed at [facility], [place] [address] [place], [place] [address]  Magnesium     Status: None   Collection Time: 12/22/17 12:10 AM  Result Value Ref Range   Magnesium 2.3 1.7 - 2.4 mg/dL    Comment: Performed at [facility], [place] [address], [place], [place] [address]  Glucose, capillary     Status: Abnormal   Collection Time: 12/22/17 12:36 AM  Result Value Ref Range   Glucose-Capillary 125 (H) 70 - 99 mg/dL  Glucose, capillary     Status: Abnormal   Collection Time: 12/22/17  4:13 AM  Result Value Ref Range   Glucose-Capillary 116 (H) 70 - 99 mg/dL  Basic metabolic panel     Status: Abnormal   Collection Time: 12/22/17  5:52 AM  Result Value Ref Range   Sodium 143 135 - 145 mmol/L   Potassium 3.5 3.5 - 5.1 mmol/L   Chloride 104 98 - 111 mmol/L   CO2 28 22 - 32 mmol/L   Glucose, Bld 111 (H) 70 - 99 mg/dL   BUN 14 8 - 23 mg/dL   Creatinine, Ser 0.89 0.61 - 1.24 mg/dL   Calcium 8.8 (L) 8.9 - 10.3 mg/dL   GFR calc non Af Amer >60 >60 mL/min   GFR calc Af Amer >60 >60 mL/min    Comment: (NOTE) The eGFR has been calculated using the CKD EPI equation. This calculation has not been validated in all clinical situations. eGFR's persistently <60 mL/min signify possible Chronic Kidney Disease.    Anion gap 11 5 - 15    Comment: Performed at [facility], [place] [address], [place], [place] [address]  Glucose, capillary     Status: Abnormal   Collection Time: 12/22/17  7:35 AM  Result Value Ref Range   Glucose-Capillary 105 (H) 70 - 99 mg/dL   Comment 1 Notify RN     Radiology/Results: Dg Abd Portable 1v-small Bowel Obstruction Protocol-initial, 8 Hr Delay  Result Date: 12/20/2017 CLINICAL DATA:  8 hour small bowel protocol EXAM: PORTABLE ABDOMEN - 1 VIEW COMPARISON:  CT 12/19/2017 FINDINGS: Dilated central small bowel loops measuring up to 4.1 cm. Radiopaque contrast present within the colon. No abnormal calcification. IMPRESSION: Dilated central small bowel loops but with contrast present in the colon. Electronically Signed   By: Donavan Foil M.D.   On: 12/20/2017 19:30    Anti-infectives: Anti-infectives (From admission, onward)   Start     Dose/Rate Route Frequency Ordered Stop   12/19/17 2200  metroNIDAZOLE (FLAGYL) IVPB 500 mg  Status:  Discontinued     500 mg 100 mL/hr over 60 Minutes Intravenous Every 8 hours 12/19/17 1943 12/21/17 1043   12/19/17 2000  ciprofloxacin (CIPRO) IVPB 400 mg  Status:  Discontinued     400 mg 200 mL/hr over 60 Minutes Intravenous Every 12 hours 12/19/17 1943 12/21/17 1043      Assessment/Plan: Problem List: Patient Active Problem List   Diagnosis Date Noted  . Crohn's ileitis, with intestinal obstruction ([place])   . SBO (small bowel obstruction) ([place]) 12/19/2017  . Vomiting 12/19/2017  . Type 2 diabetes mellitus with hyperlipidemia ([place]) 12/19/2017  . Prostate cancer ([place]) 12/06/2017  . Crohn disease ([place]) 08/28/2017    Postop ileus from prostatectomy resolving.   * No surgery found *    LOS: 3 days   Matt B. Hassell Done, MD, [facility] [phone] beeper [phone]  12/22/2017 9:45 AM

## 2017-12-22 NOTE — Progress Notes (Signed)
PROGRESS NOTE    Jp Eastham  EVO:[id number] DOB: 1956-01-31 DOA: 12/19/2017 PCP: Curlene Labrum, MD   Brief Narrative:  62 year old male with a history of type 2 diabetes, prostate cancer status post robotic assisted laparoscopic radical prostatectomy on 9/5 by Dr. Noralee Chars, hyperlipidemia came to the hospital with complaints of abdominal pain nausea and vomiting.  Patient had this off and on for last several months and has previously been evaluated by GI and the work-up was negative.  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  Case was discussed by Dr. Arnoldo Morale, Dr. Alyson Ingles with [place] [facility] here at [facility] who recommended transferring the patient here for further management.  NG tube was placed which made it feel a whole lot better.  He was also empirically started on steroids and antibiotics.  Surgery and gastroenterology were consulted.   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  Tolerated liquid diet.  Plans on continuing liquid diet today and bowel prep with anticipation for colonoscopy tomorrow. -Gastroenterology and general surgery following-appreciate their input - 08/28/2017 ASCA levels elevated at 36 concerning for Crohn's disease which is likely culprit for the stricture.  Continue IV steroids and will transition to oral with GI recommendations. -Continue IV Solu-Medrol, will transition to oral at the time of discharge -Antibiotics discontinued on 9/20  Prostate cancer status post  laparoscopic radical robotic prostatectomy 9/5, stable - Per urology, Dr Alyson Ingles, he is passing urine and stable from their perspective.  Continue to provide supportive care.  Diabetes mellitus type 2 - Accu-Cheks and sliding scale.  Metformin is on hold. -On pre-meal insulin 5 units  Hyperlipidemia - Resume statin when appropriate  DVT prophylaxis: Lovenox Code Status: Full code Family Communication: Wife is at the bedside Disposition Plan: Plans for colonoscopy tomorrow depending on the results and how he tolerates it we will discharge him in next 24-48 hours.  Consultants:   General surgery  Gastroenterology  Procedures:   None  Antimicrobials:   Cipro and Flagyl 9/18 >9/19   Subjective: Had an extensive discussion with the patient and his wife this morning.  Patient is somewhat hesitant to get colonoscopy done this admission but after my prolonged discussion about risks and benefit of having this done he is going to discuss this with gastroenterology to see if he can get it done tomorrow.  Otherwise no new complaints.  Review of Systems Otherwise negative except as per HPI, including: General = no fevers, chills, dizziness, malaise, fatigue HEENT/EYES = negative for pain, redness, loss of vision, double vision, blurred vision, loss of hearing, sore throat, hoarseness, dysphagia Cardiovascular= negative for chest pain, palpitation, murmurs, lower extremity swelling Respiratory/lungs= negative for shortness of breath, cough, hemoptysis, wheezing, mucus production Gastrointestinal= negative for nausea, vomiting,, abdominal pain, melena, hematemesis Genitourinary= negative for Dysuria, Hematuria, Change in Urinary Frequency MSK = Negative for arthralgia, myalgias, Back Pain, Joint swelling  Neurology= Negative for headache, seizures, numbness, tingling  Psychiatry= Negative for anxiety, depression, suicidal and homocidal ideation Allergy/Immunology= Medication/Food  allergy as listed  Skin= Negative for  Rash, lesions, ulcers, itching    Objective: Vitals:   12/21/17 0432 12/21/17 1430 12/21/17 2000 12/22/17 0415  BP: 132/71 121/70 127/68 123/71  Pulse: 68 68 65 (!) 59  Resp: '18 16 18 19  ' Temp: 98 F (36.7 C) 97.6 F (36.4 C) 98.3 F (36.8 C) 97.9 F (36.6 C)  TempSrc: Oral Oral Oral Oral  SpO2: 96% 93% 97% 97%  Weight:      Height:        Intake/Output Summary (Last 24 hours) at 12/22/2017 1107 Last data filed at 12/22/2017 0900 Gross per 24 hour  Intake 840 ml  Output -  Net 840 ml   Filed Weights   12/19/17 1043 12/19/17 1944  Weight: 106.6 kg 104.3 kg    Examination:  Constitutional: NAD, calm, comfortable Eyes: PERRL, lids and conjunctivae normal ENMT: Mucous membranes are moist. Posterior pharynx clear of any exudate or lesions.Normal dentition.  Neck: normal, supple, no masses, no thyromegaly Respiratory: clear to auscultation bilaterally, no wheezing, no crackles. Normal respiratory effort. No accessory muscle use.  Cardiovascular: Regular rate and rhythm, no murmurs / rubs / gallops. No extremity edema. 2+ pedal pulses. No carotid bruits.  Abdomen: no tenderness, no masses palpated. No hepatosplenomegaly. Bowel sounds positive.  Musculoskeletal: no clubbing / cyanosis. No joint deformity upper and lower extremities. Good ROM, no contractures. Normal muscle tone.  Skin: no rashes, lesions, ulcers. No induration Neurologic: CN 2-12 grossly intact. Sensation intact, DTR normal. Strength 5/5 in all 4.  Psychiatric: Normal judgment and insight. Alert and oriented x 3. Normal mood.    Data Reviewed:   CBC: Recent Labs  Lab 12/19/17 1126 12/19/17 2044 12/20/17 0511 12/22/17 0010  WBC 20.3* 15.7* 11.2* 11.7*  HGB 16.7 15.8 14.9 14.0  HCT 47.3 45.9 43.7 41.5  MCV 91.1 93.7 91.4 93.0  PLT 280 288 310 416   Basic Metabolic Panel: Recent Labs  Lab 12/19/17 1126 12/19/17 2044 12/20/17 0511 12/22/17 0010  12/22/17 0552  NA 137  --  141  --  143  K 3.9  --  4.4  --  3.5  CL 101  --  105  --  104  CO2 23  --  27  --  28  GLUCOSE 167*  --  189*  --  111*  BUN 9  --  11  --  14  CREATININE 0.89 0.89 0.89  --  0.89  CALCIUM 9.4  --  9.1  --  8.8*  MG  --   --   --  2.3  --    GFR: Estimated Creatinine Clearance: 96.5 mL/min (by C-G formula based on SCr of 0.89 mg/dL). Liver Function Tests: Recent Labs  Lab 12/19/17 1126 12/20/17 0511  AST 25 19  ALT 22 17  ALKPHOS 77 62  BILITOT 1.4* 0.9  PROT 8.0 7.2  ALBUMIN 4.0 3.3*   Recent Labs  Lab 12/19/17 1126  LIPASE 34   No results for input(s): AMMONIA in the last 168 hours. Coagulation Profile: No results for input(s): INR, PROTIME in the last 168 hours. Cardiac Enzymes: No results for input(s): CKTOTAL, CKMB, CKMBINDEX, TROPONINI in the last 168 hours. BNP (last 3 results) No results for input(s): PROBNP in the last 8760 hours. HbA1C: No results for input(s): HGBA1C in the last 72 hours. CBG: Recent Labs  Lab 12/21/17 1627 12/21/17 2109 12/22/17 0036 12/22/17 0413 12/22/17 0735  GLUCAP 167* 165* 125* 116* 105*   Lipid Profile: No results for input(s): CHOL, HDL,  LDLCALC, TRIG, CHOLHDL, LDLDIRECT in the last 72 hours. Thyroid Function Tests: No results for input(s): TSH, T4TOTAL, FREET4, T3FREE, THYROIDAB in the last 72 hours. Anemia Panel: No results for input(s): VITAMINB12, FOLATE, FERRITIN, TIBC, IRON, RETICCTPCT in the last 72 hours. Sepsis Labs: No results for input(s): PROCALCITON, LATICACIDVEN in the last 168 hours.  No results found for this or any previous visit (from the past 240 hour(s)).       Radiology Studies: Dg Abd Portable 1v-small Bowel Obstruction Protocol-initial, 8 Hr Delay  Result Date: 12/20/2017 CLINICAL DATA:  8 hour small bowel protocol EXAM: PORTABLE ABDOMEN - 1 VIEW COMPARISON:  CT 12/19/2017 FINDINGS: Dilated central small bowel loops measuring up to 4.1 cm. Radiopaque contrast  present within the colon. No abnormal calcification. IMPRESSION: Dilated central small bowel loops but with contrast present in the colon. Electronically Signed   By: Donavan Foil M.D.   On: 12/20/2017 19:30        Scheduled Meds: . enoxaparin (LOVENOX) injection  40 mg Subcutaneous Q24H  . insulin aspart  0-9 Units Subcutaneous Q4H  . methylPREDNISolone (SOLU-MEDROL) injection  60 mg Intravenous Q24H  . peg 3350 powder  0.5 kit Oral Once   And  . peg 3350 powder  0.5 kit Oral Once   Continuous Infusions:    LOS: 3 days   Time spent= 40 mins    Delane Stalling Arsenio Loader, MD [facility] Pager [phone]   If 7PM-7AM, please contact night-coverage [email or web address] Password [facility] 12/22/2017, 11:07 AM

## 2017-12-23 ENCOUNTER — Inpatient Hospital Stay (HOSPITAL_COMMUNITY): Payer: BLUE CROSS/BLUE SHIELD | Admitting: Certified Registered Nurse Anesthetist

## 2017-12-23 ENCOUNTER — Encounter (HOSPITAL_COMMUNITY): Payer: Self-pay | Admitting: Certified Registered Nurse Anesthetist

## 2017-12-23 ENCOUNTER — Encounter (HOSPITAL_COMMUNITY)
Admission: EM | Disposition: A | Discharge status: Home / Self Care | Payer: Self-pay | Source: Home / Self Care | Attending: Internal Medicine

## 2017-12-23 HISTORY — PX: COLONOSCOPY WITH PROPOFOL: SHX5780

## 2017-12-23 HISTORY — PX: BIOPSY: SHX5522

## 2017-12-23 LAB — GLUCOSE, CAPILLARY
GLUCOSE-CAPILLARY: 108 mg/dL — AB (ref 70–99)
Glucose-Capillary: 111 mg/dL — ABNORMAL HIGH (ref 70–99)
Glucose-Capillary: 113 mg/dL — ABNORMAL HIGH (ref 70–99)
Glucose-Capillary: 177 mg/dL — ABNORMAL HIGH (ref 70–99)

## 2017-12-23 SURGERY — COLONOSCOPY WITH PROPOFOL
Anesthesia: Monitor Anesthesia Care

## 2017-12-23 MED ORDER — PROPOFOL 10 MG/ML IV BOLUS
INTRAVENOUS | Status: AC
  Filled 2017-12-23: qty 40

## 2017-12-23 MED ORDER — PREDNISONE 20 MG PO TABS: 40.0000 mg | ORAL_TABLET | Freq: Every day | ORAL | 0 refills | Status: AC

## 2017-12-23 MED ORDER — LACTATED RINGERS IV SOLN
INTRAVENOUS | Status: DC | PRN
  Administered 2017-12-23: 09:00:00 via INTRAVENOUS

## 2017-12-23 MED ORDER — PROPOFOL 500 MG/50ML IV EMUL
INTRAVENOUS | Status: DC | PRN
  Administered 2017-12-23: 60 mg via INTRAVENOUS

## 2017-12-23 MED ORDER — PROPOFOL 500 MG/50ML IV EMUL
INTRAVENOUS | Status: DC | PRN
  Administered 2017-12-23: 150 ug/kg/min via INTRAVENOUS

## 2017-12-23 MED ORDER — PREDNISONE 20 MG PO TABS
40.0000 mg | ORAL_TABLET | Freq: Every day | ORAL | Status: DC
  Administered 2017-12-23: 40 mg via ORAL
  Filled 2017-12-23: qty 2

## 2017-12-23 MED ORDER — PREDNISONE 10 MG PO TABS: 40.0000 mg | ORAL_TABLET | Freq: Every day | ORAL | 0 refills | Status: DC

## 2017-12-23 SURGICAL SUPPLY — 22 items

## 2017-12-23 NOTE — Discharge Summary (Signed)
Physician Discharge Summary  Brett Trevino KZS:[id number] DOB: Aug 19, 1955 DOA: 12/19/2017  PCP: Curlene Labrum, MD  Admit date: 12/19/2017 Discharge date: 12/23/2017  Admitted From: Home  Disposition:  Home   Recommendations for Outpatient Follow-up:  1. Follow up with PCP in 1-2 weeks 2. Please obtain BMP/CBC in one week your next doctors visit.  3. Follow up with GI outpatient, Their service to notify patient about the results of Biopsy 4. Take Prednisone 40mg  po daily for 30 days (dispense 20mg  tabs), in the meantime follow up with GI so they can taper it   Home Health: Home  Equipment/Devices: Home  Discharge Condition: Stable CODE STATUS: Full Code  Diet recommendation: Low Fiber/Soft  Brief/Interim Summary:  62 year old male with a history of type 2 diabetes, prostate cancer status post robotic assisted laparoscopic radical prostatectomy on 9/5 by Dr. Noralee Chars, hyperlipidemia came to the hospital with complaints of abdominal pain nausea and vomiting.  Patient had this off and on for last several months and has previously been evaluated by GI and the work-up was negative.  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  Case was discussed by Dr. Arnoldo Morale, Dr. Alyson Ingles with [place] surgery here at [facility] who recommended transferring the patient here for further management.  NG tube was placed which made it feel a whole lot better.  He was also empirically started on steroids and antibiotics.  Surgery and gastroenterology were consulted.  Initially patient had NG tube placed for about 36 hrs and he started doing significantly better therefore it was removed.  Initially was also started on antibiotics but it was discontinued after couple of days.  Solu-Medrol was continued.  He underwent colonoscopy on 9/22 which he tolerated well without any issues.  Biopsies were taken.  He was advised to keep  him on oral prednisone and follow-up outpatient with gastroenterology.  At this time patient has reached maximal benefit from hospital stay and stable to be discharged with outpatient follow-up recommendations as stated above.  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  Biopsies were taken, this will be followed up outpatient with gastroenterology and patient will be informed about this.  We will discharge him on 40 mg of prednisone, will be tapered outpatient by gastroenterology. -Gastroenterology and general surgery following-appreciate their input - 08/28/2017 ASCA levels elevated at 65 concerning for Crohn's disease which is likely culprit for the stricture.  -Antibiotics discontinued on 9/20  Prostate cancer status post laparoscopic radical robotic prostatectomy 9/5, stable - Per urology, Dr Alyson Ingles, he is passing urine and stable from their perspective.  Continue to provide supportive care.  Diabetes mellitus type 2 - Resume home meds  Hyperlipidemia - Resume statin when appropriate  Patient was on Lovenox for DVT prophylaxis while here Discharge the patient today in stable condition CODE STATUS-full  Discharge Instructions   Allergies as of 12/23/2017   No Known Allergies     Medication List    TAKE these medications   atorvastatin 10 MG tablet Commonly known as:  LIPITOR Take 10  mg by mouth every evening.   metFORMIN 500 MG tablet Commonly known as:  GLUCOPHAGE Take 500 mg by mouth 2 (two) times daily with a meal.   predniSONE 10 MG tablet Commonly known  as:  DELTASONE Take 4 tablets (40 mg total) by mouth daily.      Follow-up Information    Gatha Mayer, MD. Go to.   Specialty:  Gastroenterology Why:  Office will contact patient about results and follow-up Contact information: [address] [place] [address] [phone]        Curlene Labrum, MD. Schedule an appointment as soon as possible for a visit in 1 week(s).   Specialty:  Family Medicine Contact information: [place] [address] [phone]          No Known Allergies  You were cared for by a hospitalist during your hospital stay. If you have any questions about your discharge medications or the care you received while you were in the hospital after you are discharged, you can call the unit and asked to speak with the hospitalist on call if the hospitalist that took care of you is not available. Once you are discharged, your primary care physician will handle any further medical issues. Please note that no refills for any discharge medications will be authorized once you are discharged, as it is imperative that you return to your primary care physician (or establish a relationship with a primary care physician if you do not have one) for your aftercare needs so that they can reassess your need for medications and monitor your lab values.  Consultations:  GI   Gen Surgery   Procedures/Studies: Ct Abdomen Pelvis W Contrast  Result Date: 12/19/2017 CLINICAL DATA:  Vomiting and abdominal pain. EXAM: CT ABDOMEN AND PELVIS WITH CONTRAST TECHNIQUE: Multidetector CT imaging of the abdomen and pelvis was performed using the standard protocol following bolus administration of intravenous contrast. CONTRAST:  <See Chart> ISOVUE-300 IOPAMIDOL (ISOVUE-300) INJECTION 61%, 54mL ISOVUE-300 IOPAMIDOL (ISOVUE-300) INJECTION 61%, 144mL ISOVUE-300 IOPAMIDOL (ISOVUE-300) INJECTION 61% COMPARISON:  09/20/2017. FINDINGS: Lower chest: Lung bases show no acute  findings. Heart is at the upper limits of normal in size. No pericardial or pleural effusion. Prepericardiac lymph nodes are subcentimeter in short axis size. Distal esophagus is dilated and fluid-filled. Hepatobiliary: Liver is slightly decreased in attenuation diffusely. Liver and gallbladder are otherwise unremarkable. No biliary ductal dilatation. Pancreas: Negative. Spleen: Negative. Adrenals/Urinary Tract: Adrenal glands are unremarkable. Tiny stone in the right kidney. Kidneys are otherwise unremarkable. Ureters are decompressed. Bladder is low in volume. Stomach/Bowel: Stomach is dilated and fluid-filled. Duodenum and proximal jejunum are decompressed. There is dilated and fluid-filled distal jejunum and ileum, to the level of an apparent small bowel stricture in the right lower quadrant (series 2, image 60), measuring approximately 6.7 cm. Remainder of the small bowel is decompressed. Appendix and colon are unremarkable. Vascular/Lymphatic: Atherosclerotic calcification of the arterial vasculature without abdominal aortic aneurysm. Retroperitoneal lymph nodes are not enlarged by CT size criteria. Circumaortic left renal vein. Reproductive: Prostatectomy. Other: Mild presacral edema. Trace interloop fluid. Mesenteries and peritoneum are otherwise unremarkable. No free air. Musculoskeletal: Degenerative changes in the spine. No worrisome lytic or sclerotic lesions. IMPRESSION: 1. 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 2. Hepatic steatosis. 3. Tiny right renal stone. 4.  Aortic atherosclerosis (ICD10-170.0). Electronically Signed   By: Lorin Picket M.D.   On: 12/19/2017 14:18   Dg  Chest Portable 1 View  Result Date: 12/19/2017 CLINICAL DATA:  Nasogastric tube placement EXAM: PORTABLE CHEST 1 VIEW COMPARISON:  None. FINDINGS: The side port of the nasogastric tube projects over the stomach. There is shallow lung inflation with bibasilar  atelectasis. No pleural effusion or pneumothorax. Normal cardiomediastinal contours. IMPRESSION: Nasogastric tube side port projects over the stomach. Electronically Signed   By: Ulyses Jarred M.D.   On: 12/19/2017 17:26   Dg Abd Portable 1v-small Bowel Obstruction Protocol-initial, 8 Hr Delay  Result Date: 12/20/2017 CLINICAL DATA:  8 hour small bowel protocol EXAM: PORTABLE ABDOMEN - 1 VIEW COMPARISON:  CT 12/19/2017 FINDINGS: Dilated central small bowel loops measuring up to 4.1 cm. Radiopaque contrast present within the colon. No abnormal calcification. IMPRESSION: Dilated central small bowel loops but with contrast present in the colon. Electronically Signed   By: Donavan Foil M.D.   On: 12/20/2017 19:30     Subjective: No complaints, feels better.   General = no fevers, chills, dizziness, malaise, fatigue HEENT/EYES = negative for pain, redness, loss of vision, double vision, blurred vision, loss of hearing, sore throat, hoarseness, dysphagia Cardiovascular= negative for chest pain, palpitation, murmurs, lower extremity swelling Respiratory/lungs= negative for shortness of breath, cough, hemoptysis, wheezing, mucus production Gastrointestinal= negative for nausea, vomiting,, abdominal pain, melena, hematemesis Genitourinary= negative for Dysuria, Hematuria, Change in Urinary Frequency MSK = Negative for arthralgia, myalgias, Back Pain, Joint swelling  Neurology= Negative for headache, seizures, numbness, tingling  Psychiatry= Negative for anxiety, depression, suicidal and homocidal ideation Allergy/Immunology= Medication/Food allergy as listed  Skin= Negative for Rash, lesions, ulcers, itching   Discharge Exam: Vitals:   12/23/17 0935 12/23/17 0940  BP:  120/77  Pulse: (!) 55 (!) 54  Resp: 14 15  Temp:    SpO2: 95% 96%   Vitals:   12/23/17 0925 12/23/17 0930 12/23/17 0935 12/23/17 0940  BP:  126/79  120/77  Pulse: (!) 56 (!) 57 (!) 55 (!) 54  Resp: 14 16 14 15   Temp:       TempSrc:      SpO2: 100% 100% 95% 96%  Weight:      Height:        General: Pt is alert, awake, not in acute distress Cardiovascular: RRR, S1/S2 +, no rubs, no gallops Respiratory: CTA bilaterally, no wheezing, no rhonchi Abdominal: Soft, NT, ND, bowel sounds + Extremities: no edema, no cyanosis    The results of significant diagnostics from this hospitalization (including imaging, microbiology, ancillary and laboratory) are listed below for reference.     Microbiology: No results found for this or any previous visit (from the past 240 hour(s)).   Labs: BNP (last 3 results) No results for input(s): BNP in the last 8760 hours. Basic Metabolic Panel: Recent Labs  Lab 12/19/17 1126 12/19/17 2044 12/20/17 0511 12/22/17 0010 12/22/17 0552  NA 137  --  141  --  143  K 3.9  --  4.4  --  3.5  CL 101  --  105  --  104  CO2 23  --  27  --  28  GLUCOSE 167*  --  189*  --  111*  BUN 9  --  11  --  14  CREATININE 0.89 0.89 0.89  --  0.89  CALCIUM 9.4  --  9.1  --  8.8*  MG  --   --   --  2.3  --    Liver Function Tests: Recent Labs  Lab 12/19/17 1126 12/20/17  0511  AST 25 19  ALT 22 17  ALKPHOS 77 62  BILITOT 1.4* 0.9  PROT 8.0 7.2  ALBUMIN 4.0 3.3*   Recent Labs  Lab 12/19/17 1126  LIPASE 34   No results for input(s): AMMONIA in the last 168 hours. CBC: Recent Labs  Lab 12/19/17 1126 12/19/17 2044 12/20/17 0511 12/22/17 0010  WBC 20.3* 15.7* 11.2* 11.7*  HGB 16.7 15.8 14.9 14.0  HCT 47.3 45.9 43.7 41.5  MCV 91.1 93.7 91.4 93.0  PLT 280 288 310 294   Cardiac Enzymes: No results for input(s): CKTOTAL, CKMB, CKMBINDEX, TROPONINI in the last 168 hours. BNP: Invalid input(s): POCBNP CBG: Recent Labs  Lab 12/22/17 1626 12/22/17 2033 12/23/17 0010 12/23/17 0442 12/23/17 0813  GLUCAP 192* 186* 111* 113* 108*   D-Dimer No results for input(s): DDIMER in the last 72 hours. Hgb A1c No results for input(s): HGBA1C in the last 72 hours. Lipid  Profile No results for input(s): CHOL, HDL, LDLCALC, TRIG, CHOLHDL, LDLDIRECT in the last 72 hours. Thyroid function studies No results for input(s): TSH, T4TOTAL, T3FREE, THYROIDAB in the last 72 hours.  Invalid input(s): FREET3 Anemia work up No results for input(s): VITAMINB12, FOLATE, FERRITIN, TIBC, IRON, RETICCTPCT in the last 72 hours. Urinalysis    Component Value Date/Time   COLORURINE YELLOW 12/19/2017 1849   APPEARANCEUR CLEAR 12/19/2017 1849   LABSPEC 1.042 (H) 12/19/2017 1849   PHURINE 6.0 12/19/2017 1849   GLUCOSEU NEGATIVE 12/19/2017 1849   HGBUR NEGATIVE 12/19/2017 1849   BILIRUBINUR NEGATIVE 12/19/2017 1849   KETONESUR 20 (A) 12/19/2017 1849   PROTEINUR NEGATIVE 12/19/2017 1849   NITRITE NEGATIVE 12/19/2017 1849   LEUKOCYTESUR NEGATIVE 12/19/2017 1849   Sepsis Labs Invalid input(s): PROCALCITONIN,  WBC,  LACTICIDVEN Microbiology No results found for this or any previous visit (from the past 240 hour(s)).   Time coordinating discharge:  I have spent 35 minutes face to face with the patient and on the ward discussing the patients care, assessment, plan and disposition with other care givers. >50% of the time was devoted counseling the patient about the risks and benefits of treatment/Discharge disposition and coordinating care.   SIGNED:   Damita Lack, MD  [facility] 12/23/2017, 9:48 AM Pager   If 7PM-7AM, please contact night-coverage [email or web address] Password [email or web address]

## 2017-12-23 NOTE — Transfer of Care (Signed)
Immediate Anesthesia Transfer of Care Note  Patient: Brett Trevino  Procedure(s) Performed: COLONOSCOPY WITH PROPOFOL (N/A )  Patient Location: PACU  Anesthesia Type:MAC  Level of Consciousness: awake, alert  and oriented  Airway & Oxygen Therapy: Patient Spontanous Breathing and Patient connected to face mask oxygen  Post-op Assessment: Report given to RN and Post -op Vital signs reviewed and stable  Post vital signs: Reviewed and stable  Last Vitals:  Vitals Value Taken Time  BP    Temp    Pulse    Resp    SpO2      Last Pain:  Vitals:   12/23/17 0834  TempSrc: Oral  PainSc: 0-No pain      Patients Stated Pain Goal: 3 (35/32/99 2426)  Complications: No apparent anesthesia complications

## 2017-12-23 NOTE — Progress Notes (Signed)
Patient ID: Brett Trevino, male   DOB: 1955/07/29, 62 y.o.   MRN: [id number] Looks much better today.  Ileus resolving.  Surgery will sign off.    Kaylyn Lim, MD. Allegra Grana

## 2017-12-23 NOTE — Op Note (Signed)
[facility] Patient Name: Brett Trevino Procedure Date: 12/23/2017 MRN: [id number] Attending MD: Gatha Mayer , MD Date of Birth: March 27, 1956 CSN: [id number] Age: 62 Admit Type: Inpatient Procedure:                Colonoscopy Indications:              Suspected Crohn's disease of the small bowel Providers:                Gatha Mayer, MD, Elmer Ramp. Tilden Dome, RN, Nevin Bloodgood, Technician, Herbie Drape, CRNA Referring MD:              Medicines:                Propofol per Anesthesia, Monitored Anesthesia Care Complications:            No immediate complications. Estimated Blood Loss:     Estimated blood loss was minimal. Procedure:                Pre-Anesthesia Assessment:                           - Prior to the procedure, a History and Physical                            was performed, and patient medications and                            allergies were reviewed. The patient's tolerance of                            previous anesthesia was also reviewed. The risks                            and benefits of the procedure and the sedation                            options and risks were discussed with the patient.                            All questions were answered, and informed consent                            was obtained. Prior Anticoagulants: The patient                            last took Lovenox (enoxaparin) 1 day prior to the                            procedure. ASA Grade Assessment: III - A patient                            with severe systemic disease. After reviewing the  risks and benefits, the patient was deemed in                            satisfactory condition to undergo the procedure.                           After obtaining informed consent, the colonoscope                            was passed under direct vision. Throughout the                            procedure, the patient's  blood pressure, pulse, and                            oxygen saturations were monitored continuously. The                            CF-HQ190L ([id number]) Olympus adult colonoscope was                            introduced through the anus and advanced to the the                            terminal ileum, with identification of the                            appendiceal orifice and IC valve. The colonoscopy                            was somewhat difficult due to significant looping.                            Successful completion of the procedure was aided by                            applying abdominal pressure. The patient tolerated                            the procedure well. The quality of the bowel                            preparation was good. The terminal ileum, ileocecal                            valve, appendiceal orifice, and rectum were                            photographed. The bowel preparation used was                            MoviPrep. Scope In: 8:52:26 AM Scope Out: 9:11:56 AM Scope Withdrawal Time: 0 hours 13 minutes 37 seconds  Total Procedure Duration: 0 hours 19  minutes 30 seconds  Findings:      The perianal examination was normal.      The digital rectal exam findings include rectal tenderness and       surgically absent prostate.      An area of mucosa in the terminal ileum was mildly eroded, nodular and       vascular-pattern-decreased. Biopsies were taken with a cold forceps for       histology. Verification of patient identification for the specimen was       done. Estimated blood loss was minimal.      A few diverticula were found in the sigmoid colon and descending colon.      Anal papilla(e) were hypertrophied.      The exam was otherwise without abnormality on direct and retroflexion       views. Impression:               - Rectal tenderness and a surgically absent                            prostate found on digital rectal exam.                            - Eroded, nodular and vascular-pattern-decreased                            mucosa in the terminal ileum. Biopsied. Minimal                            changes - hard to see on the photos but could                            represent Crohn's. Stricture found on CT more                            proximal than I could reach with scope.                           - Diverticulosis in the sigmoid colon and in the                            descending colon.                           - Anal papilla(e) were hypertrophied.                           - The examination was otherwise normal on direct                            and retroflexion views. Moderate Sedation:      N/A- Per Anesthesia Care Recommendation:           - Return patient to hospital ward for possible                            discharge same day.                           -  DC TODAY UNLESS PROBLEMS                           I WILL FOLLOW-UP PATHOLOGY AND CONTACT THE PATIENT                            AND ARRANGE FOLLOW-UP IN MY OFFICE - WILL CONSIDER                            BIOLOGIC TX AND BEGIN LAB W/U FOR THAT THEN HE IS                            JUST A FEW WEEKS OUT FROM PROSTATECTOMY FOR CANCER                            SO NOT RIGHT TIME TO INITIATE BIOLOGICS                           RX PREDNISONE 10 MG TABS TAKE 4 EACH DAY # 120 NO                            REFILL - WILL TRY TO TAPER AS RAPIDLY AS POSSIBLE                            SINCE HE IS STLL HEALING FROM PROSTATECTOMY -                            CONSIDER A SWITCH TO BUDESONIDE AS OUTPATIENT                           FULL LIQUID - ADVANCE TO LOW FIBER (INSTRUCTIONS                            ATTACHED) Procedure Code(s):        --- Professional ---                           [phone], Colonoscopy, flexible; with biopsy, single                            or multiple Diagnosis Code(s):        --- Professional ---                            K62.89, Other specified diseases of anus and rectum                           Z90.79, Acquired absence of other genital organ(s)                           K63.89, Other specified diseases of intestine                           K57.30,  Diverticulosis of large intestine without                            perforation or abscess without bleeding CPT copyright 2017 American Medical Association. All rights reserved. The codes documented in this report are preliminary and upon coder review may  be revised to meet current compliance requirements. Gatha Mayer, MD 12/23/2017 9:29:05 AM This report has been signed electronically. Number of Addenda: 0

## 2017-12-23 NOTE — Discharge Instructions (Signed)
° °  THERE WERE SOME MINOR ABNORMALITIES THAT MIGHT SHOW CROHN'S DISEASE ON BIOPSIES - BUT NO CONCLUSIVE PROOF. DESPITE THAT THE EVIDENCE INDICATES YOU HAVE CROHN'S ILEITIS.  DR. Carlean Purl WILL CALL WITH RESULTS AND PLANS LATER THIS WEEK.  PLEASE START ON A FULL LIQUID DIET AND THEN TRY LOW FIBER IN 1-2 DAYS.  YOU HAD AN ENDOSCOPIC PROCEDURE TODAY: Refer to the procedure report and other information in the discharge instructions given to you for any specific questions about what was found during the examination. If this information does not answer your questions, please call Dr. Celesta Aver office at [phone] to clarify.   YOU SHOULD EXPECT: Some feelings of bloating in the abdomen. Passage of more gas than usual. Walking can help get rid of the air that was put into your GI tract during the procedure and reduce the bloating. If you had a lower endoscopy (such as a colonoscopy or flexible sigmoidoscopy) you may notice spotting of blood in your stool or on the toilet paper. Some abdominal soreness may be present for a day or two, also.  DIET: Your first meal following the procedure should be a light meal and then it is ok to progress to your normal diet. A half-sandwich or bowl of soup is an example of a good first meal. Heavy or fried foods are harder to digest and may make you feel nauseous or bloated. Drink plenty of fluids but you should avoid alcoholic beverages for 24 hours.   ACTIVITY: Your care partner should take you home directly after the procedure. You should plan to take it easy, moving slowly for the rest of the day. You can resume normal activity the day after the procedure however YOU SHOULD NOT DRIVE, use power tools, machinery or perform tasks that involve climbing or major physical exertion for 24 hours (because of the sedation medicines used during the test).   SYMPTOMS TO REPORT IMMEDIATELY: A gastroenterologist can be reached at any hour. Please call [phone]  for any of the  following symptoms:  Following lower endoscopy (colonoscopy, flexible sigmoidoscopy) Excessive amounts of blood in the stool  Significant tenderness, worsening of abdominal pains  Swelling of the abdomen that is new, acute  Fever of 100 or higher

## 2017-12-23 NOTE — Progress Notes (Signed)
Discharge instructions/prescription given/explained with pt and daughter verbalizing understanding. Pt aware of followup appointments.

## 2017-12-23 NOTE — Progress Notes (Signed)
Pt will eat a full liquid breakfast this morning post colonoscopy. Pt has returned to room alert, oriented, and without c/o. Pt's daughter in room. Pt has d/c order for home early afternoon. Pt will leave with daughter.

## 2017-12-23 NOTE — Interval H&P Note (Signed)
History and Physical Interval Note:  12/23/2017 8:37 AM  Brett Trevino  has presented today for surgery, with the diagnosis of SBO, Crohns  The various methods of treatment have been discussed with the patient and family. After consideration of risks, benefits and other options for treatment, the patient has consented to  Procedure(s): COLONOSCOPY WITH PROPOFOL (N/A) as a surgical intervention .  The patient's history has been reviewed, patient examined, no change in status, stable for surgery.  I have reviewed the patient's chart and labs.  Questions were answered to the patient's satisfaction.     Silvano Rusk

## 2017-12-23 NOTE — Anesthesia Preprocedure Evaluation (Addendum)
Anesthesia Evaluation  Patient identified by MRN, date of birth, ID band Patient awake    Reviewed: Allergy & Precautions, NPO status , Patient's Chart, lab work & pertinent test results  Airway Mallampati: II  TM Distance: >3 FB Neck ROM: Full    Dental no notable dental hx.    Pulmonary neg pulmonary ROS,    Pulmonary exam normal breath sounds clear to auscultation       Cardiovascular negative cardio ROS Normal cardiovascular exam Rhythm:Regular Rate:Normal  ECG: NSR, rate 79   Neuro/Psych negative neurological ROS  negative psych ROS   GI/Hepatic Neg liver ROS, Crohns   Endo/Other  diabetes, Oral Hypoglycemic Agents  Renal/GU negative Renal ROS   H/o prostate cancer    Musculoskeletal negative musculoskeletal ROS (+)   Abdominal (+) + obese,   Peds  Hematology HLD   Anesthesia Other Findings Crohns  Reproductive/Obstetrics                            Anesthesia Physical Anesthesia Plan  ASA: III  Anesthesia Plan: MAC   Post-op Pain Management:    Induction: Intravenous  PONV Risk Score and Plan: 1 and Propofol infusion and Treatment may vary due to age or medical condition  Airway Management Planned: Nasal Cannula  Additional Equipment:   Intra-op Plan:   Post-operative Plan:   Informed Consent: I have reviewed the patients History and Physical, chart, labs and discussed the procedure including the risks, benefits and alternatives for the proposed anesthesia with the patient or authorized representative who has indicated his/her understanding and acceptance.     Plan Discussed with: CRNA  Anesthesia Plan Comments:        Anesthesia Quick Evaluation

## 2017-12-23 NOTE — Anesthesia Postprocedure Evaluation (Signed)
Anesthesia Post Note  Patient: Brett Trevino  Procedure(s) Performed: COLONOSCOPY WITH PROPOFOL (N/A )     Patient location during evaluation: PACU Anesthesia Type: MAC Level of consciousness: awake and alert Pain management: pain level controlled Vital Signs Assessment: post-procedure vital signs reviewed and stable Respiratory status: spontaneous breathing, nonlabored ventilation, respiratory function stable and patient connected to nasal cannula oxygen Cardiovascular status: stable and blood pressure returned to baseline Postop Assessment: no apparent nausea or vomiting Anesthetic complications: no    Last Vitals:  Vitals:   12/23/17 0940 12/23/17 1003  BP: 120/77 131/82  Pulse: (!) 54 (!) 54  Resp: 15 14  Temp:  36.4 C  SpO2: 96% 99%    Last Pain:  Vitals:   12/23/17 1003  TempSrc: Oral  PainSc:                  Ryan P Ellender

## 2017-12-24 ENCOUNTER — Encounter (HOSPITAL_COMMUNITY): Payer: Self-pay | Admitting: Internal Medicine

## 2017-12-25 ENCOUNTER — Encounter (INDEPENDENT_AMBULATORY_CARE_PROVIDER_SITE_OTHER): Payer: Self-pay | Admitting: Internal Medicine

## 2017-12-25 ENCOUNTER — Ambulatory Visit (INDEPENDENT_AMBULATORY_CARE_PROVIDER_SITE_OTHER): Payer: BLUE CROSS/BLUE SHIELD | Admitting: Internal Medicine

## 2017-12-25 VITALS — BP 110/70 | HR 64 | Temp 97.3°F | Resp 18 | Ht 67.0 in | Wt 221.4 lb

## 2017-12-25 DIAGNOSIS — K50019 Crohn's disease of small intestine with unspecified complications: Secondary | ICD-10-CM

## 2017-12-25 NOTE — Patient Instructions (Signed)
Physician will call with results of biopsy.  Humira. 6-mercaptopurine or Purinethol.

## 2017-12-25 NOTE — Progress Notes (Signed)
Presenting complaint;  Follow-up for ileal disease.  Patient felt to have Crohn's disease.  Database and subjective:  Patient is 62 year old Caucasian male who was hospitalized at [facility] in May this year for abdominal pain nausea and vomiting and CT revealed thickening to terminal ileum.  He was felt to have Crohn's disease.  He was treated with Cipro and Flagyl and got better.  He was seen in our office on 08/28/2017 when he was doing well. Sed rate was 9.  IBD panel was positive for ASCA.  Follow-up CT on 09/20/2017 revealed decrease in ileal wall thickening.  Plan was for him to have diagnostic colonoscopy.  However in the meantime he was diagnosed with prostate cancer and had robotic surgery on 12/06/2017 by Dr. Alinda Money. He presented to emergency room on 12/19/2017 with abdominal pain and vomiting.  CT revealed worsening thickening to distal small bowel with narrowing and dilated small bowel upstream.  He also had fatty liver and nonobstructing stone in right kidney. There was concern for postop complication since he had robotic prostate surgery 2 weeks earlier.  Patient was treated with IV Solu-Medrol and NG decompression.  He was transferred to [facility].  He improved with medical therapy.  He underwent colonoscopy by Dr. Carlean Purl revealing inflammatory changes of the terminal ileum.  Examination was limited.  He could not reach the stricture.  He felt the findings are consistent with Crohn's disease.  Patient had this procedure 2 days ago when he was able to come home that day and he is on prednisone.  Presently he feels fine he denies abdominal pain nausea or vomiting.  He is watching his diet.  He states he has had sporadic episodes of cramping abdominal pain associated with nausea and vomiting dating back to when he was in his 55s.  He may have couple episodes in a year.  In 2013 he underwent EGD and colonoscopy while he was living in [place].  Terminal ileum and colonoscopy was  normal. He has had these symptoms more frequently over the last 1 year.  In May 2019 he was admitted to [facility] rocking him as above and last week to [facility]. He does not take OTC NSAIDs.  Family history is negative for inflammatory bowel disease.  He lost 1 brother of MI at age 19.  His mother had cardiac surgery for valve disease.  She is 64 years old. Patient states she has lost 40 pounds since May.  He has lost 21 pounds since Aug 28, 2017 the last time he was seen in our office.   Current Medications: Outpatient Encounter Medications as of 12/25/2017  Medication Sig  . atorvastatin (LIPITOR) 10 MG tablet Take 10 mg by mouth every evening.   . metFORMIN (GLUCOPHAGE) 500 MG tablet Take 500 mg by mouth 2 (two) times daily with a meal.   . predniSONE (DELTASONE) 20 MG tablet Take 2 tablets (40 mg total) by mouth daily with breakfast.   No facility-administered encounter medications on file as of 12/25/2017.      Objective: Blood pressure 110/70, pulse 64, temperature (!) 97.3 F (36.3 C), temperature source Oral, resp. rate 18, height 5\' 7"  (1.702 m), weight 221 lb 6.4 oz (100.4 kg). Patient is alert and in no acute distress. Conjunctiva is pink. Sclera is nonicteric Oropharyngeal mucosa is normal. No lymphadenopathy or thyromegaly noted. Cardiac exam with regular rhythm normal S1 and S2. No murmur or gallop noted. Lungs are clear to auscultation. Abdomen is full.  It is symmetrical.  Laparoscopy scars noted from recent prostatectomy.  Bowel sounds are normal.  On palpation abdomen is soft.  He has fullness and mild tenderness on deep palpation in right mid lower quadrant.  No organomegaly or masses. No LE edema or clubbing noted.   Assessment:  #1.  Terminal ileitis.  It is interesting to know that he has had sporadic symptoms for several years and had normal TI on colonoscopy of 2013.  Now he has endoscopic evidence of active disease and terminal ileum.  Biopsy results are  pending.  ASCA was positive which is typically seen with Crohn's disease.  He is on low-dose prednisone and likely it is not resulting in hyperglycemia given that he is diabetic.  This is only bridged to a more definite therapy. Need to review biopsy results before definite recommendations made but options include Biologics or 6-MP.  He will need to negative TB testing and hepatitis B surface antigen which he had at [facility] less than a year ago.  Plan:  Request recent records of testing for TB and hepatitis B surface antigen from [facility]. Will check sed rate and CRP. Treatment options reviewed with patient.  While 6-MP therapy would be appropriate it may take several weeks before benefits seen in with the Biologics response is evident fairly quickly.  He would like to read about these medications before making final decision. I will contact patient with results of blood test and after biopsy results reviewed. Patient advised not to take OTC NSAIDs. He will stay on low residue diet. Office visit in 8 weeks.

## 2017-12-26 ENCOUNTER — Other Ambulatory Visit (INDEPENDENT_AMBULATORY_CARE_PROVIDER_SITE_OTHER): Payer: Self-pay | Admitting: *Deleted

## 2017-12-26 DIAGNOSIS — K5090 Crohn's disease, unspecified, without complications: Secondary | ICD-10-CM

## 2017-12-26 LAB — C-REACTIVE PROTEIN: CRP: 1.3 mg/L (ref <8.0)

## 2017-12-26 LAB — SEDIMENTATION RATE: SED RATE: 9 mm/h (ref 0–20)

## 2017-12-27 DIAGNOSIS — E119 Type 2 diabetes mellitus without complications: Secondary | ICD-10-CM | POA: Diagnosis not present

## 2017-12-27 DIAGNOSIS — Z6834 Body mass index (BMI) 34.0-34.9, adult: Secondary | ICD-10-CM | POA: Diagnosis not present

## 2017-12-27 DIAGNOSIS — K5000 Crohn's disease of small intestine without complications: Secondary | ICD-10-CM | POA: Diagnosis not present

## 2017-12-27 DIAGNOSIS — K56609 Unspecified intestinal obstruction, unspecified as to partial versus complete obstruction: Secondary | ICD-10-CM | POA: Diagnosis not present

## 2017-12-27 DIAGNOSIS — C61 Malignant neoplasm of prostate: Secondary | ICD-10-CM | POA: Diagnosis not present

## 2017-12-27 DIAGNOSIS — E782 Mixed hyperlipidemia: Secondary | ICD-10-CM | POA: Diagnosis not present

## 2018-01-01 ENCOUNTER — Ambulatory Visit (HOSPITAL_COMMUNITY)
Admission: RE | Admit: 2018-01-01 | Discharge: 2018-01-01 | Disposition: A | Discharge status: Home / Self Care | Payer: BLUE CROSS/BLUE SHIELD | Source: Ambulatory Visit | Attending: Internal Medicine | Admitting: Internal Medicine

## 2018-01-01 DIAGNOSIS — K5090 Crohn's disease, unspecified, without complications: Secondary | ICD-10-CM

## 2018-01-01 DIAGNOSIS — I700 Atherosclerosis of aorta: Secondary | ICD-10-CM | POA: Diagnosis not present

## 2018-01-01 DIAGNOSIS — R1900 Intra-abdominal and pelvic swelling, mass and lump, unspecified site: Secondary | ICD-10-CM | POA: Diagnosis not present

## 2018-01-01 MED ORDER — GADOBUTROL 1 MMOL/ML IV SOLN
10.0000 mL | Freq: Once | INTRAVENOUS | Status: AC | PRN
  Administered 2018-01-01: 10 mL via INTRAVENOUS

## 2018-01-01 MED ORDER — GLUCAGON HCL RDNA (DIAGNOSTIC) 1 MG IJ SOLR
1.0000 mg | Freq: Once | INTRAMUSCULAR | Status: AC
  Administered 2018-01-01: 1 mg via INTRAVENOUS

## 2018-01-01 MED ORDER — GLUCAGON HCL RDNA (DIAGNOSTIC) 1 MG IJ SOLR
INTRAMUSCULAR | Status: AC
  Administered 2018-01-01: 1 mg via INTRAVENOUS
  Filled 2018-01-01: qty 1

## 2018-01-02 NOTE — Progress Notes (Signed)
Let him know the biopsies are normal as expected - think Crohn's further up into the intestine  1) He needs f/u Amy next week (she knows him) 2) F/U me in November 3) As long as he is feeling better reduce prednisone from 40 to 30 mg daily  Let me know if ?  Explain he will see Amy and then f/u me next time (November - about 1 month after Amy)  We will plan to do pre-biologic evaluation and any other work-up as dictated by his clinical course

## 2018-01-03 ENCOUNTER — Other Ambulatory Visit (INDEPENDENT_AMBULATORY_CARE_PROVIDER_SITE_OTHER): Payer: Self-pay | Admitting: *Deleted

## 2018-01-03 DIAGNOSIS — N393 Stress incontinence (female) (male): Secondary | ICD-10-CM | POA: Diagnosis not present

## 2018-01-03 DIAGNOSIS — K5090 Crohn's disease, unspecified, without complications: Secondary | ICD-10-CM

## 2018-01-03 DIAGNOSIS — M6281 Muscle weakness (generalized): Secondary | ICD-10-CM | POA: Diagnosis not present

## 2018-01-03 NOTE — Progress Notes (Signed)
He had requested to see me/us Please clarify and schedule if appropriate

## 2018-01-04 ENCOUNTER — Telehealth: Payer: Self-pay | Admitting: Internal Medicine

## 2018-01-04 ENCOUNTER — Ambulatory Visit (INDEPENDENT_AMBULATORY_CARE_PROVIDER_SITE_OTHER): Payer: BLUE CROSS/BLUE SHIELD | Admitting: Internal Medicine

## 2018-01-04 NOTE — Telephone Encounter (Signed)
Gatha Mayer, MD  Marlon Pel, RN        He had requested to see me/us  Please clarify and schedule if appropriate

## 2018-01-04 NOTE — Telephone Encounter (Signed)
The pt's wife states that the pt is seeing Dr Laural Golden.

## 2018-01-05 ENCOUNTER — Other Ambulatory Visit (INDEPENDENT_AMBULATORY_CARE_PROVIDER_SITE_OTHER): Payer: Self-pay | Admitting: Internal Medicine

## 2018-01-05 DIAGNOSIS — K50018 Crohn's disease of small intestine with other complication: Secondary | ICD-10-CM

## 2018-01-05 MED ORDER — MERCAPTOPURINE 50 MG PO TABS: 100.0000 mg | ORAL_TABLET | Freq: Every day | ORAL | 5 refills | Status: DC

## 2018-01-07 DIAGNOSIS — E782 Mixed hyperlipidemia: Secondary | ICD-10-CM | POA: Diagnosis not present

## 2018-01-07 DIAGNOSIS — K5000 Crohn's disease of small intestine without complications: Secondary | ICD-10-CM | POA: Diagnosis not present

## 2018-01-07 DIAGNOSIS — K5090 Crohn's disease, unspecified, without complications: Secondary | ICD-10-CM | POA: Diagnosis not present

## 2018-01-07 DIAGNOSIS — E119 Type 2 diabetes mellitus without complications: Secondary | ICD-10-CM | POA: Diagnosis not present

## 2018-01-13 LAB — HEPATITIS C ANTIBODY
Hepatitis C Ab: NONREACTIVE
SIGNAL TO CUT-OFF: 0.02 (ref <1.00)

## 2018-01-13 LAB — THIOPURINE METHYLTRANSFERASE (TPMT), RBC: Thiopurine Methyltransferase, RBC: 18 nmol/hr/mL RBC

## 2018-01-13 LAB — HEPATITIS B SURFACE ANTIGEN: HEP B S AG: NONREACTIVE

## 2018-01-14 ENCOUNTER — Other Ambulatory Visit (INDEPENDENT_AMBULATORY_CARE_PROVIDER_SITE_OTHER): Payer: Self-pay | Admitting: *Deleted

## 2018-01-14 DIAGNOSIS — K56609 Unspecified intestinal obstruction, unspecified as to partial versus complete obstruction: Secondary | ICD-10-CM

## 2018-01-14 DIAGNOSIS — K5090 Crohn's disease, unspecified, without complications: Secondary | ICD-10-CM

## 2018-01-18 DIAGNOSIS — R350 Frequency of micturition: Secondary | ICD-10-CM | POA: Diagnosis not present

## 2018-01-18 DIAGNOSIS — N393 Stress incontinence (female) (male): Secondary | ICD-10-CM | POA: Diagnosis not present

## 2018-01-18 DIAGNOSIS — M6281 Muscle weakness (generalized): Secondary | ICD-10-CM | POA: Diagnosis not present

## 2018-01-18 DIAGNOSIS — R351 Nocturia: Secondary | ICD-10-CM | POA: Diagnosis not present

## 2018-01-22 DIAGNOSIS — K5000 Crohn's disease of small intestine without complications: Secondary | ICD-10-CM | POA: Diagnosis not present

## 2018-01-22 DIAGNOSIS — C61 Malignant neoplasm of prostate: Secondary | ICD-10-CM | POA: Diagnosis not present

## 2018-01-22 DIAGNOSIS — K56609 Unspecified intestinal obstruction, unspecified as to partial versus complete obstruction: Secondary | ICD-10-CM | POA: Diagnosis not present

## 2018-01-22 DIAGNOSIS — E782 Mixed hyperlipidemia: Secondary | ICD-10-CM | POA: Diagnosis not present

## 2018-01-24 ENCOUNTER — Encounter: Payer: Self-pay | Admitting: Nutrition

## 2018-01-24 ENCOUNTER — Encounter: Payer: BLUE CROSS/BLUE SHIELD | Attending: Family Medicine | Admitting: Nutrition

## 2018-01-24 VITALS — Ht 67.0 in | Wt 220.0 lb

## 2018-01-24 DIAGNOSIS — E782 Mixed hyperlipidemia: Secondary | ICD-10-CM | POA: Insufficient documentation

## 2018-01-24 DIAGNOSIS — K5090 Crohn's disease, unspecified, without complications: Secondary | ICD-10-CM | POA: Diagnosis not present

## 2018-01-24 DIAGNOSIS — Z713 Dietary counseling and surveillance: Secondary | ICD-10-CM | POA: Diagnosis not present

## 2018-01-24 DIAGNOSIS — K50018 Crohn's disease of small intestine with other complication: Secondary | ICD-10-CM

## 2018-01-24 DIAGNOSIS — E119 Type 2 diabetes mellitus without complications: Secondary | ICD-10-CM | POA: Insufficient documentation

## 2018-01-24 NOTE — Patient Instructions (Addendum)
Goals 1. Follow MY Plate 2. Eat meals on time 3 Choose almond milk instead of regular milk 4. Add yogurt or kifer 5. Follow information on Crohns disease handout

## 2018-01-24 NOTE — Progress Notes (Signed)
  Medical Nutrition Therapy:  Appt start time: 0800 end time:  0900.   Assessment:  Primary concerns today: DM Typd 2. Crohns Dz. LIves with his wife. He is a Theme park manager. Has been in the hospital a few times this past year. Had prosectomty a few months ago due to prostate cancer.  Eats 2-3 meals per day.  A1C. Has been up a little since last check due to steroids. He notes he has lost about 20-30 lbs over the last 6 months due to these issues.  Frustrated to know what to eat. Eats most meals at home.  Willing to make changes to help reduce complications from his Crohns disease. Lab Results  Component Value Date   HGBA1C 5.8 (H) 11/26/2017   CMP Latest Ref Rng & Units 12/22/2017 12/20/2017 12/19/2017  Glucose 70 - 99 mg/dL 111(H) 189(H) -  BUN 8 - 23 mg/dL 14 11 -  Creatinine 0.61 - 1.24 mg/dL 0.89 0.89 0.89  Sodium 135 - 145 mmol/L 143 141 -  Potassium 3.5 - 5.1 mmol/L 3.5 4.4 -  Chloride 98 - 111 mmol/L 104 105 -  CO2 22 - 32 mmol/L 28 27 -  Calcium 8.9 - 10.3 mg/dL 8.8(L) 9.1 -  Total Protein 6.5 - 8.1 g/dL - 7.2 -  Total Bilirubin 0.3 - 1.2 mg/dL - 0.9 -  Alkaline Phos 38 - 126 U/L - 62 -  AST 15 - 41 U/L - 19 -  ALT 0 - 44 U/L - 17 -     Preferred Learning Style:   No preference indicated   Learning Readiness:   Ready  Change in progress   MEDICATIONS:   DIETARY INTAKE:   Eats 3 meals. 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   Usual physical activity: Walks some.  Estimated energy needs: 1800 calories 200  g carbohydrates 135 g protein 50 g fat  Progress Towards Goal(s):  In progress.   Nutritional Diagnosis:  NB-1.1 Food and nutrition-related knowledge deficit As related to Diabetes and Crohns disease.  As evidenced by A1c > 6.5% and treatment for Crohns disease..    Intervention:  .Nutrition therapy for Crohns disease and DM. Meal planning. Portion sizes. Checking blood sugars and target ranges.  Goals 1. Follow MY Plate 2. Eat meals on  time 3 Choose almond milk instead of regular milk 4. Add yogurt or kifer 5. Follow information on Crohns disease handout   Teaching Method Utilized:  Visual Auditory Hands on  Handouts given during visit include:  The Plate Method   Meal Plan Card  Barriers to learning/adherence to lifestyle change: none  Demonstrated degree of understanding via:  Teach Back   Monitoring/Evaluation:  Dietary intake, exercise, meal planning , and body weight in 1 month(s).

## 2018-01-28 ENCOUNTER — Encounter (INDEPENDENT_AMBULATORY_CARE_PROVIDER_SITE_OTHER): Payer: Self-pay | Admitting: *Deleted

## 2018-01-28 ENCOUNTER — Other Ambulatory Visit (INDEPENDENT_AMBULATORY_CARE_PROVIDER_SITE_OTHER): Payer: Self-pay | Admitting: *Deleted

## 2018-01-28 DIAGNOSIS — K56609 Unspecified intestinal obstruction, unspecified as to partial versus complete obstruction: Secondary | ICD-10-CM

## 2018-01-28 DIAGNOSIS — K50018 Crohn's disease of small intestine with other complication: Secondary | ICD-10-CM

## 2018-01-28 DIAGNOSIS — K5090 Crohn's disease, unspecified, without complications: Secondary | ICD-10-CM

## 2018-02-11 DIAGNOSIS — K5090 Crohn's disease, unspecified, without complications: Secondary | ICD-10-CM | POA: Diagnosis not present

## 2018-02-11 DIAGNOSIS — K50018 Crohn's disease of small intestine with other complication: Secondary | ICD-10-CM | POA: Diagnosis not present

## 2018-02-11 DIAGNOSIS — K56609 Unspecified intestinal obstruction, unspecified as to partial versus complete obstruction: Secondary | ICD-10-CM | POA: Diagnosis not present

## 2018-02-12 ENCOUNTER — Other Ambulatory Visit (INDEPENDENT_AMBULATORY_CARE_PROVIDER_SITE_OTHER): Payer: Self-pay | Admitting: Internal Medicine

## 2018-02-12 ENCOUNTER — Other Ambulatory Visit (INDEPENDENT_AMBULATORY_CARE_PROVIDER_SITE_OTHER): Payer: Self-pay | Admitting: *Deleted

## 2018-02-12 DIAGNOSIS — R748 Abnormal levels of other serum enzymes: Secondary | ICD-10-CM

## 2018-02-15 DIAGNOSIS — N393 Stress incontinence (female) (male): Secondary | ICD-10-CM | POA: Diagnosis not present

## 2018-02-15 DIAGNOSIS — M6281 Muscle weakness (generalized): Secondary | ICD-10-CM | POA: Diagnosis not present

## 2018-02-17 LAB — CBC WITH DIFFERENTIAL/PLATELET
BASOS PCT: 0.8 %
Basophils Absolute: 30 cells/uL (ref 0–200)
EOS ABS: 30 {cells}/uL (ref 15–500)
EOS PCT: 0.8 %
HCT: 41.5 % (ref 38.5–50.0)
HEMOGLOBIN: 13.9 g/dL (ref 13.2–17.1)
Lymphs Abs: 745 cells/uL — ABNORMAL LOW (ref 850–3900)
MCH: 31.2 pg (ref 27.0–33.0)
MCHC: 33.5 g/dL (ref 32.0–36.0)
MCV: 93.3 fL (ref 80.0–100.0)
MONOS PCT: 9.5 %
MPV: 10.1 fL (ref 7.5–12.5)
NEUTROS ABS: 2633 {cells}/uL (ref 1500–7800)
Neutrophils Relative %: 69.3 %
PLATELETS: 353 10*3/uL (ref 140–400)
RBC: 4.45 10*6/uL (ref 4.20–5.80)
RDW: 15.1 % — ABNORMAL HIGH (ref 11.0–15.0)
TOTAL LYMPHOCYTE: 19.6 %
WBC mixed population: 361 cells/uL (ref 200–950)
WBC: 3.8 10*3/uL (ref 3.8–10.8)

## 2018-02-17 LAB — HEPATIC FUNCTION PANEL
AG Ratio: 1.3 (calc) (ref 1.0–2.5)
ALBUMIN MSPROF: 3.5 g/dL — AB (ref 3.6–5.1)
ALKALINE PHOSPHATASE (APISO): 124 U/L — AB (ref 40–115)
ALT: 86 U/L — AB (ref 9–46)
AST: 74 U/L — AB (ref 10–35)
BILIRUBIN DIRECT: 0.3 mg/dL — AB (ref 0.0–0.2)
Globulin: 2.6 g/dL (calc) (ref 1.9–3.7)
Indirect Bilirubin: 0.6 mg/dL (calc) (ref 0.2–1.2)
Total Bilirubin: 0.9 mg/dL (ref 0.2–1.2)
Total Protein: 6.1 g/dL (ref 6.1–8.1)

## 2018-02-17 LAB — THIOPURINE METHYLTRANSFERASE (TPMT), RBC: Thiopurine Methyltransferase, RBC: 15 nmol/hr/mL RBC

## 2018-02-19 DIAGNOSIS — R748 Abnormal levels of other serum enzymes: Secondary | ICD-10-CM | POA: Diagnosis not present

## 2018-02-19 LAB — HEPATIC FUNCTION PANEL
AG Ratio: 1.3 (calc) (ref 1.0–2.5)
ALBUMIN MSPROF: 3.4 g/dL — AB (ref 3.6–5.1)
ALT: 53 U/L — ABNORMAL HIGH (ref 9–46)
AST: 53 U/L — AB (ref 10–35)
Alkaline phosphatase (APISO): 104 U/L (ref 40–115)
BILIRUBIN DIRECT: 0.2 mg/dL (ref 0.0–0.2)
BILIRUBIN INDIRECT: 0.5 mg/dL (ref 0.2–1.2)
Globulin: 2.6 g/dL (calc) (ref 1.9–3.7)
TOTAL PROTEIN: 6 g/dL — AB (ref 6.1–8.1)
Total Bilirubin: 0.7 mg/dL (ref 0.2–1.2)

## 2018-02-21 ENCOUNTER — Other Ambulatory Visit (INDEPENDENT_AMBULATORY_CARE_PROVIDER_SITE_OTHER): Payer: Self-pay | Admitting: *Deleted

## 2018-02-21 DIAGNOSIS — R748 Abnormal levels of other serum enzymes: Secondary | ICD-10-CM

## 2018-02-25 DIAGNOSIS — R748 Abnormal levels of other serum enzymes: Secondary | ICD-10-CM | POA: Diagnosis not present

## 2018-02-26 ENCOUNTER — Ambulatory Visit (INDEPENDENT_AMBULATORY_CARE_PROVIDER_SITE_OTHER): Payer: BLUE CROSS/BLUE SHIELD | Admitting: Internal Medicine

## 2018-02-26 ENCOUNTER — Encounter (INDEPENDENT_AMBULATORY_CARE_PROVIDER_SITE_OTHER): Payer: Self-pay | Admitting: Internal Medicine

## 2018-02-26 VITALS — BP 116/70 | HR 65 | Temp 98.7°F | Resp 18 | Ht 67.0 in | Wt 223.8 lb

## 2018-02-26 DIAGNOSIS — K50019 Crohn's disease of small intestine with unspecified complications: Secondary | ICD-10-CM | POA: Diagnosis not present

## 2018-02-26 DIAGNOSIS — J209 Acute bronchitis, unspecified: Secondary | ICD-10-CM | POA: Diagnosis not present

## 2018-02-26 DIAGNOSIS — E782 Mixed hyperlipidemia: Secondary | ICD-10-CM | POA: Diagnosis not present

## 2018-02-26 DIAGNOSIS — C61 Malignant neoplasm of prostate: Secondary | ICD-10-CM | POA: Diagnosis not present

## 2018-02-26 DIAGNOSIS — R748 Abnormal levels of other serum enzymes: Secondary | ICD-10-CM

## 2018-02-26 DIAGNOSIS — Z6834 Body mass index (BMI) 34.0-34.9, adult: Secondary | ICD-10-CM | POA: Diagnosis not present

## 2018-02-26 LAB — HEPATIC FUNCTION PANEL
AG Ratio: 1.5 (calc) (ref 1.0–2.5)
ALKALINE PHOSPHATASE (APISO): 95 U/L (ref 40–115)
ALT: 42 U/L (ref 9–46)
AST: 41 U/L — AB (ref 10–35)
Albumin: 3.5 g/dL — ABNORMAL LOW (ref 3.6–5.1)
BILIRUBIN DIRECT: 0.1 mg/dL (ref 0.0–0.2)
BILIRUBIN INDIRECT: 0.4 mg/dL (ref 0.2–1.2)
GLOBULIN: 2.4 g/dL (ref 1.9–3.7)
Total Bilirubin: 0.5 mg/dL (ref 0.2–1.2)
Total Protein: 5.9 g/dL — ABNORMAL LOW (ref 6.1–8.1)

## 2018-02-26 NOTE — Patient Instructions (Signed)
Next blood work to be done on 03/24/2018.

## 2018-02-26 NOTE — Progress Notes (Signed)
Presenting complaint;  Follow-up for small bowel Crohn's disease.  Database and subjective:  Patient is 62 year old Caucasian male who has chronic diarrhea.  He was hospitalized at [facility] in May this year for nausea vomiting and abdominal pain and felt to have distal small bowel thickening and he was felt to have Crohn's disease.  He was treated with Cipro and metronidazole and felt better.  He was seen in our office following that admission.  IBD panel was positive for ASCA.  He had another CT on 09/20/2017 which revealed decrease in ileal wall thickening.  Plans are made for diagnostic colonoscopy but in the meantime he was diagnosed with prostate carcinoma and underwent robotic surgery on 12/06/2017.  About 2 weeks later he developed abdominal pain with nausea and vomiting.  CT revealed thickening to distal small bowel with proximal dilation.  There was concern for postop complication and he was admitted to [facility] and treated with antibiotics and steroids. He was last seen on 12/25/2017.  Hepatitis B surface antigen and PPD were negative.  Treatment options were reviewed with the patient and he opted for immunomodulator.  TPMT assay was normal. Prednisone taper was begun and he was started on 6-MP about 6 weeks ago.  He had blood work on 02/11/2018 when his transaminases are elevated.  WBC was 3.8 and ANC was 2633.  Patient did not have any symptoms. He was advised to discontinue atorvastatin and 6-MP dose was decreased to 75 mg/day.  LFTs were repeated on 02/19/2018 and again yesterday.  He has no complaints other than URI symptoms for which she was seen by Dr. Pleas Koch and given prescription for antibiotic earlier today but he does not remember the name.  He has been off prednisone for about 5 weeks.  He did not have any relapse of symptoms when he came off prednisone.  He is not having any side effects with 6-MP. He remains with increased stool frequency.  Ranges between 2 and 4.  He is  already had 3 bowel movements today.  Stools are loose to semi-formed.  He denies melena or rectal bleeding.  His appetite is good and he is not having abdominal pain or cramping.  He works 50 to 60 hours a week but he feels he gets plenty of rest.    Current Medications: Outpatient Encounter Medications as of 02/26/2018  Medication Sig  . mercaptopurine (PURINETHOL) 50 MG tablet Take 1.5 tablets (75 mg total) by mouth daily. Give on an empty stomach 1 hour before or 2 hours after meals. Caution: Chemotherapy.  . metFORMIN (GLUCOPHAGE) 500 MG tablet Take 500 mg by mouth daily.   . Multiple Vitamin (MULTIVITAMIN) LIQD Take 5 mLs by mouth daily.  . Vitamin D, Ergocalciferol, (DRISDOL) 50000 units CAPS capsule Take 50,000 Units by mouth every 7 (seven) days.  . [DISCONTINUED] predniSONE (DELTASONE) 20 MG tablet Take 20 mg by mouth daily with breakfast.   No facility-administered encounter medications on file as of 02/26/2018.      Objective: Blood pressure 116/70, pulse 65, temperature 98.7 F (37.1 C), temperature source Oral, resp. rate 18, height 5\' 7"  (1.702 m), weight 223 lb 12.8 oz (101.5 kg). Patient is alert and in no acute distress. Conjunctiva is pink. Sclera is nonicteric Oropharyngeal mucosa is normal. No neck masses or thyromegaly noted. Cardiac exam with regular rhythm normal S1 and S2. No murmur or gallop noted. Lungs are clear to auscultation. Abdomen is symmetrical bowel sounds are normal.  On palpation abdomen is soft  and nontender with organomegaly or masses. No LE edema or clubbing noted.  Labs/studies Results: Lab data from 02/11/2018 Bilirubin 0.9, AP 124, AST 74 and ALT 86.  AB albumin 3.5.  LFTs from 02/18/2018 Bilirubin 0.7, AP 104, AST 53, ALT 53 and albumin 3.4.  LFTs from 02/24/2018 Bilirubin 0.5, AP 95, AST 41, ALT 42 and albumin 3.5.   Assessment:  #1.  Small bowel Crohn's disease.  He has been able able to taper prednisone without relapse of  symptoms.  He has been on 6-MP for about 7 weeks.  Dose had to be adjusted because of elevated transaminases.  His WBC was low normal but ANC was over 2600.  He is not having abdominal pain.  Overall he appears to be doing well.  #2.  Elevated transaminases most likely secondary to 6-mercaptopurine.  Atorvastatin was stopped to be on the safe side.  Transaminases are near normal.  He has been on atorvastatin for few years.   Plan:  Resume atorvastatin at a dose of 10 mg p.o. Daily. Continue 6-MP at 75 mg p.o. Daily. CBC with differential and comprehensive chemistry panel on 03/22/2018 Office visit in 3 months.

## 2018-02-27 ENCOUNTER — Other Ambulatory Visit (INDEPENDENT_AMBULATORY_CARE_PROVIDER_SITE_OTHER): Payer: Self-pay | Admitting: *Deleted

## 2018-02-27 ENCOUNTER — Encounter (INDEPENDENT_AMBULATORY_CARE_PROVIDER_SITE_OTHER): Payer: Self-pay | Admitting: *Deleted

## 2018-02-27 DIAGNOSIS — K50019 Crohn's disease of small intestine with unspecified complications: Secondary | ICD-10-CM

## 2018-02-27 DIAGNOSIS — R748 Abnormal levels of other serum enzymes: Secondary | ICD-10-CM

## 2018-03-08 DIAGNOSIS — C61 Malignant neoplasm of prostate: Secondary | ICD-10-CM | POA: Diagnosis not present

## 2018-03-08 DIAGNOSIS — N393 Stress incontinence (female) (male): Secondary | ICD-10-CM | POA: Diagnosis not present

## 2018-03-15 DIAGNOSIS — N393 Stress incontinence (female) (male): Secondary | ICD-10-CM | POA: Diagnosis not present

## 2018-03-15 DIAGNOSIS — M6281 Muscle weakness (generalized): Secondary | ICD-10-CM | POA: Diagnosis not present

## 2018-03-22 DIAGNOSIS — K50019 Crohn's disease of small intestine with unspecified complications: Secondary | ICD-10-CM | POA: Diagnosis not present

## 2018-03-22 DIAGNOSIS — R748 Abnormal levels of other serum enzymes: Secondary | ICD-10-CM | POA: Diagnosis not present

## 2018-03-22 LAB — CBC WITH DIFFERENTIAL/PLATELET
ABSOLUTE MONOCYTES: 340 {cells}/uL (ref 200–950)
BASOS ABS: 50 {cells}/uL (ref 0–200)
BASOS PCT: 1 %
EOS ABS: 110 {cells}/uL (ref 15–500)
Eosinophils Relative: 2.2 %
HCT: 43.7 % (ref 38.5–50.0)
Hemoglobin: 15.1 g/dL (ref 13.2–17.1)
Lymphs Abs: 1090 cells/uL (ref 850–3900)
MCH: 32.9 pg (ref 27.0–33.0)
MCHC: 34.6 g/dL (ref 32.0–36.0)
MCV: 95.2 fL (ref 80.0–100.0)
MPV: 9.9 fL (ref 7.5–12.5)
Monocytes Relative: 6.8 %
Neutro Abs: 3410 cells/uL (ref 1500–7800)
Neutrophils Relative %: 68.2 %
PLATELETS: 285 10*3/uL (ref 140–400)
RBC: 4.59 10*6/uL (ref 4.20–5.80)
RDW: 15.5 % — ABNORMAL HIGH (ref 11.0–15.0)
Total Lymphocyte: 21.8 %
WBC: 5 10*3/uL (ref 3.8–10.8)

## 2018-03-22 LAB — COMPREHENSIVE METABOLIC PANEL
AG RATIO: 1.4 (calc) (ref 1.0–2.5)
ALBUMIN MSPROF: 4 g/dL (ref 3.6–5.1)
ALT: 22 U/L (ref 9–46)
AST: 21 U/L (ref 10–35)
Alkaline phosphatase (APISO): 65 U/L (ref 40–115)
BUN: 13 mg/dL (ref 7–25)
CO2: 30 mmol/L (ref 20–32)
CREATININE: 0.9 mg/dL (ref 0.70–1.25)
Calcium: 9.7 mg/dL (ref 8.6–10.3)
Chloride: 103 mmol/L (ref 98–110)
GLOBULIN: 2.8 g/dL (ref 1.9–3.7)
GLUCOSE: 162 mg/dL — AB (ref 65–139)
POTASSIUM: 4.1 mmol/L (ref 3.5–5.3)
SODIUM: 140 mmol/L (ref 135–146)
Total Bilirubin: 0.6 mg/dL (ref 0.2–1.2)
Total Protein: 6.8 g/dL (ref 6.1–8.1)

## 2018-03-25 ENCOUNTER — Other Ambulatory Visit (INDEPENDENT_AMBULATORY_CARE_PROVIDER_SITE_OTHER): Payer: Self-pay | Admitting: *Deleted

## 2018-03-25 DIAGNOSIS — K5090 Crohn's disease, unspecified, without complications: Secondary | ICD-10-CM

## 2018-03-25 DIAGNOSIS — E119 Type 2 diabetes mellitus without complications: Secondary | ICD-10-CM | POA: Diagnosis not present

## 2018-05-23 ENCOUNTER — Ambulatory Visit: Payer: BLUE CROSS/BLUE SHIELD | Admitting: Nutrition

## 2018-05-27 ENCOUNTER — Other Ambulatory Visit (INDEPENDENT_AMBULATORY_CARE_PROVIDER_SITE_OTHER): Payer: Self-pay | Admitting: Internal Medicine

## 2018-05-27 DIAGNOSIS — K5090 Crohn's disease, unspecified, without complications: Secondary | ICD-10-CM | POA: Diagnosis not present

## 2018-05-27 LAB — HEPATIC FUNCTION PANEL
AG Ratio: 1.4 (calc) (ref 1.0–2.5)
ALBUMIN MSPROF: 3.8 g/dL (ref 3.6–5.1)
ALT: 20 U/L (ref 9–46)
AST: 21 U/L (ref 10–35)
Alkaline phosphatase (APISO): 53 U/L (ref 35–144)
BILIRUBIN DIRECT: 0.1 mg/dL (ref 0.0–0.2)
Globulin: 2.7 g/dL (calc) (ref 1.9–3.7)
Indirect Bilirubin: 0.6 mg/dL (calc) (ref 0.2–1.2)
Total Bilirubin: 0.7 mg/dL (ref 0.2–1.2)
Total Protein: 6.5 g/dL (ref 6.1–8.1)

## 2018-05-27 LAB — CBC WITH DIFFERENTIAL/PLATELET
ABSOLUTE MONOCYTES: 456 {cells}/uL (ref 200–950)
Basophils Absolute: 28 cells/uL (ref 0–200)
Basophils Relative: 0.7 %
Eosinophils Absolute: 128 cells/uL (ref 15–500)
Eosinophils Relative: 3.2 %
HEMATOCRIT: 41.7 % (ref 38.5–50.0)
HEMOGLOBIN: 14.6 g/dL (ref 13.2–17.1)
LYMPHS ABS: 744 {cells}/uL — AB (ref 850–3900)
MCH: 32.6 pg (ref 27.0–33.0)
MCHC: 35 g/dL (ref 32.0–36.0)
MCV: 93.1 fL (ref 80.0–100.0)
MPV: 10.3 fL (ref 7.5–12.5)
Monocytes Relative: 11.4 %
NEUTROS ABS: 2644 {cells}/uL (ref 1500–7800)
NEUTROS PCT: 66.1 %
Platelets: 253 10*3/uL (ref 140–400)
RBC: 4.48 10*6/uL (ref 4.20–5.80)
RDW: 12.9 % (ref 11.0–15.0)
Total Lymphocyte: 18.6 %
WBC: 4 10*3/uL (ref 3.8–10.8)

## 2018-05-28 ENCOUNTER — Encounter (INDEPENDENT_AMBULATORY_CARE_PROVIDER_SITE_OTHER): Payer: Self-pay | Admitting: Internal Medicine

## 2018-05-28 ENCOUNTER — Ambulatory Visit (INDEPENDENT_AMBULATORY_CARE_PROVIDER_SITE_OTHER): Payer: BLUE CROSS/BLUE SHIELD | Admitting: Internal Medicine

## 2018-05-28 VITALS — BP 122/81 | HR 76 | Temp 98.2°F | Resp 18 | Ht 67.0 in | Wt 225.9 lb

## 2018-05-28 DIAGNOSIS — K50018 Crohn's disease of small intestine with other complication: Secondary | ICD-10-CM

## 2018-05-28 MED ORDER — LOPERAMIDE HCL 2 MG PO CAPS: 2.0000 mg | ORAL_CAPSULE | Freq: Every day | ORAL | Status: AC | PRN

## 2018-05-28 NOTE — Patient Instructions (Addendum)
Next blood work in 8 weeks. Can take Imodium OTC 2 mg daily or as needed

## 2018-05-28 NOTE — Progress Notes (Signed)
Presenting complaint;  Follow-up for Crohn's disease.  Database and subjective:  Patient is 63 year old Caucasian male who was hospitalized at [facility] rocking him in May 2019 for small bowel obstruction and diagnosed with small bowel Crohn's disease.  He responded to Cipro and Flagyl.  He was subsequently seen in our office and plans were made for him to undergo diagnostic colonoscopy.  In the meantime he was diagnosed with prostate carcinoma and colonoscopy was delayed.  Soon after his prostate surgery he developed another episode of small bowel obstruction and he was hospitalized at [facility].  Once again he responded to conservative therapy.  During that hospitalization he underwent colonoscopy by Dr. Arelia Longest (12/23/2017) revealing typical changes of Crohn's ileitis. Patient was seen in our office.  Hepatitis B surface antigen was negative.  TPMT assay was normal.  TB testing was also negative. Treatment options were discussed with patient and he opted to proceed with 6-MP which was started on 02/12/2018.  Follow-up lab testing revealed mildly elevated transaminases.  6-MP dose was decreased and atorvastatin was temporarily held and then resumed at a later date.  He had blood work yesterday as planned.  He has no complaints.  He is he has 2-3 bowel movements per day.  Most of his stools are loose to mushy.  He does not remember the last time he had a formed stool.  He also complains of urgency which generally occurs when he eats at a restaurant.  Within few minutes of his meal he has to run to the bathroom.  He denies abdominal pain nausea vomiting melena or rectal bleeding.  He has not had any accidents and he also denies nocturnal bowel movement.  He has good appetite and his weight has been stable.  At times he feels tired but he is still working close to 60 hours a week.  He does try to walk couple times a week for health reasons. He is not having any side effects with 6-MP. He states he did  see a nutritionist who recommended eliminating dairy products and he feels it may have helped him some.  Current Medications: Outpatient Encounter Medications as of 05/28/2018  Medication Sig  . atorvastatin (LIPITOR) 10 MG tablet Take 1 tablet (10 mg total) by mouth daily.  . mercaptopurine (PURINETHOL) 50 MG tablet Take 1.5 tablets (75 mg total) by mouth daily. Give on an empty stomach 1 hour before or 2 hours after meals. Caution: Chemotherapy.  . metFORMIN (GLUCOPHAGE) 500 MG tablet Take 500 mg by mouth daily.   . Multiple Vitamin (MULTIVITAMIN) LIQD Take 5 mLs by mouth daily.  Marland Kitchen VITAMIN D PO Take by mouth daily.  . [DISCONTINUED] Vitamin D, Ergocalciferol, (DRISDOL) 50000 units CAPS capsule Take 50,000 Units by mouth every 7 (seven) days.   No facility-administered encounter medications on file as of 05/28/2018.      Objective: Blood pressure 122/81, pulse 76, temperature 98.2 F (36.8 C), temperature source Oral, resp. rate 18, height '5\' 7"'  (1.702 m), weight 225 lb 14.4 oz (102.5 kg). Take a patient is alert and in no acute distress. Conjunctiva is pink. Sclera is nonicteric Oropharyngeal mucosa is normal. No neck masses or thyromegaly noted. Cardiac exam with regular rhythm normal S1 and S2. No murmur or gallop noted. Lungs are clear to auscultation. Abdomen is full.  It is symmetrical.  Small midline scar noted below the umbilicus.  Bowel sounds are normal.  On palpation abdomen is soft and nontender with organomegaly or masses. No LE  edema or clubbing noted.  Labs/studies Results:  CBC Latest Ref Rng & Units 05/27/2018 03/22/2018 02/11/2018  WBC 3.8 - 10.8 Thousand/uL 4.0 5.0 3.8  Hemoglobin 13.2 - 17.1 g/dL 14.6 15.1 13.9  Hematocrit 38.5 - 50.0 % 41.7 43.7 41.5  Platelets 140 - 400 Thousand/uL 253 285 353    CMP Latest Ref Rng & Units 05/27/2018 03/22/2018 02/25/2018  Glucose 65 - 139 mg/dL - 162(H) -  BUN 7 - 25 mg/dL - 13 -  Creatinine 0.70 - 1.25 mg/dL - 0.90 -   Sodium 135 - 146 mmol/L - 140 -  Potassium 3.5 - 5.3 mmol/L - 4.1 -  Chloride 98 - 110 mmol/L - 103 -  CO2 20 - 32 mmol/L - 30 -  Calcium 8.6 - 10.3 mg/dL - 9.7 -  Total Protein 6.1 - 8.1 g/dL 6.5 6.8 5.9(L)  Total Bilirubin 0.2 - 1.2 mg/dL 0.7 0.6 0.5  Alkaline Phos 38 - 126 U/L - - -  AST 10 - 35 U/L 21 21 41(H)  ALT 9 - 46 U/L 20 22 42    Hepatic Function Latest Ref Rng & Units 05/27/2018 03/22/2018 02/25/2018  Total Protein 6.1 - 8.1 g/dL 6.5 6.8 5.9(L)  Albumin 3.5 - 5.0 g/dL - - -  AST 10 - 35 U/L 21 21 41(H)  ALT 9 - 46 U/L 20 22 42  Alk Phosphatase 38 - 126 U/L - - -  Total Bilirubin 0.2 - 1.2 mg/dL 0.7 0.6 0.5  Bilirubin, Direct 0.0 - 0.2 mg/dL 0.1 - 0.1     Assessment:  #1.  Small bowel Crohn's disease presenting as 2 distinct episodes of small bowel obstruction requiring brief hospitalization.  He has been on 6-MP for about 14 weeks.  He is tolerating the medication without any side effects.  He will undergo repeat imaging in the next 3 to 4 months to determine if therapy is working otherwise he may have to be switched to a biologic. He may also have an element of irritable bowel syndrome since he has intermittent postprandial diarrhea with urgency but no pain. WBC is trending down.  Therefore CBC will be checked earlier than 12 weeks.  #2.  History of mildly elevated transaminases.  Transaminases are now normal.  6-MP dose was decreased by 25 mg and atorvastatin was held temporarily.  He is back on atorvastatin.   Plan:  CBC with differential in 8 weeks. Patient can take loperamide/Imodium OTC 2 mg daily PRN. Patient will continue 6-MP at a dose of 75 mg by mouth daily.

## 2018-06-10 DIAGNOSIS — C61 Malignant neoplasm of prostate: Secondary | ICD-10-CM | POA: Diagnosis not present

## 2018-06-20 ENCOUNTER — Encounter (INDEPENDENT_AMBULATORY_CARE_PROVIDER_SITE_OTHER): Payer: Self-pay | Admitting: *Deleted

## 2018-06-20 ENCOUNTER — Other Ambulatory Visit (INDEPENDENT_AMBULATORY_CARE_PROVIDER_SITE_OTHER): Payer: Self-pay | Admitting: *Deleted

## 2018-06-20 DIAGNOSIS — K50018 Crohn's disease of small intestine with other complication: Secondary | ICD-10-CM

## 2018-07-15 DIAGNOSIS — Z0001 Encounter for general adult medical examination with abnormal findings: Secondary | ICD-10-CM | POA: Diagnosis not present

## 2018-07-18 DIAGNOSIS — Z6834 Body mass index (BMI) 34.0-34.9, adult: Secondary | ICD-10-CM | POA: Diagnosis not present

## 2018-07-18 DIAGNOSIS — Z0001 Encounter for general adult medical examination with abnormal findings: Secondary | ICD-10-CM | POA: Diagnosis not present

## 2018-07-24 DIAGNOSIS — K50018 Crohn's disease of small intestine with other complication: Secondary | ICD-10-CM | POA: Diagnosis not present

## 2018-07-24 LAB — CBC WITH DIFFERENTIAL/PLATELET
Absolute Monocytes: 380 cells/uL (ref 200–950)
Basophils Absolute: 40 cells/uL (ref 0–200)
Basophils Relative: 1 %
Eosinophils Absolute: 108 cells/uL (ref 15–500)
Eosinophils Relative: 2.7 %
HCT: 43.1 % (ref 38.5–50.0)
Hemoglobin: 15.1 g/dL (ref 13.2–17.1)
Lymphs Abs: 740 cells/uL — ABNORMAL LOW (ref 850–3900)
MCH: 32.5 pg (ref 27.0–33.0)
MCHC: 35 g/dL (ref 32.0–36.0)
MCV: 92.7 fL (ref 80.0–100.0)
MPV: 10.3 fL (ref 7.5–12.5)
Monocytes Relative: 9.5 %
Neutro Abs: 2732 cells/uL (ref 1500–7800)
Neutrophils Relative %: 68.3 %
Platelets: 238 10*3/uL (ref 140–400)
RBC: 4.65 10*6/uL (ref 4.20–5.80)
RDW: 13.8 % (ref 11.0–15.0)
Total Lymphocyte: 18.5 %
WBC: 4 10*3/uL (ref 3.8–10.8)

## 2018-07-29 ENCOUNTER — Other Ambulatory Visit (INDEPENDENT_AMBULATORY_CARE_PROVIDER_SITE_OTHER): Payer: Self-pay | Admitting: *Deleted

## 2018-07-29 ENCOUNTER — Encounter (INDEPENDENT_AMBULATORY_CARE_PROVIDER_SITE_OTHER): Payer: Self-pay | Admitting: *Deleted

## 2018-07-29 DIAGNOSIS — K5090 Crohn's disease, unspecified, without complications: Secondary | ICD-10-CM

## 2018-08-04 ENCOUNTER — Other Ambulatory Visit (INDEPENDENT_AMBULATORY_CARE_PROVIDER_SITE_OTHER): Payer: Self-pay | Admitting: Internal Medicine

## 2018-08-28 DIAGNOSIS — K5090 Crohn's disease, unspecified, without complications: Secondary | ICD-10-CM | POA: Diagnosis not present

## 2018-08-29 LAB — COMPREHENSIVE METABOLIC PANEL
AG Ratio: 1.4 (calc) (ref 1.0–2.5)
ALT: 23 U/L (ref 9–46)
AST: 23 U/L (ref 10–35)
Albumin: 3.9 g/dL (ref 3.6–5.1)
Alkaline phosphatase (APISO): 51 U/L (ref 35–144)
BUN: 9 mg/dL (ref 7–25)
CO2: 29 mmol/L (ref 20–32)
Calcium: 9.2 mg/dL (ref 8.6–10.3)
Chloride: 106 mmol/L (ref 98–110)
Creat: 0.93 mg/dL (ref 0.70–1.25)
Globulin: 2.8 g/dL (calc) (ref 1.9–3.7)
Glucose, Bld: 112 mg/dL (ref 65–139)
Potassium: 4.3 mmol/L (ref 3.5–5.3)
Sodium: 142 mmol/L (ref 135–146)
Total Bilirubin: 0.7 mg/dL (ref 0.2–1.2)
Total Protein: 6.7 g/dL (ref 6.1–8.1)

## 2018-08-29 LAB — CBC
HCT: 42.9 % (ref 38.5–50.0)
Hemoglobin: 14.7 g/dL (ref 13.2–17.1)
MCH: 32.3 pg (ref 27.0–33.0)
MCHC: 34.3 g/dL (ref 32.0–36.0)
MCV: 94.3 fL (ref 80.0–100.0)
MPV: 10.6 fL (ref 7.5–12.5)
Platelets: 237 Thousand/uL (ref 140–400)
RBC: 4.55 Million/uL (ref 4.20–5.80)
RDW: 14.1 % (ref 11.0–15.0)
WBC: 4.3 Thousand/uL (ref 3.8–10.8)

## 2018-08-29 LAB — C-REACTIVE PROTEIN: CRP: 1 mg/L

## 2018-09-03 ENCOUNTER — Encounter (INDEPENDENT_AMBULATORY_CARE_PROVIDER_SITE_OTHER): Payer: Self-pay | Admitting: Internal Medicine

## 2018-09-03 ENCOUNTER — Other Ambulatory Visit: Payer: Self-pay

## 2018-09-03 ENCOUNTER — Ambulatory Visit (INDEPENDENT_AMBULATORY_CARE_PROVIDER_SITE_OTHER): Payer: BC Managed Care – PPO | Admitting: Internal Medicine

## 2018-09-03 VITALS — BP 126/79 | HR 65 | Temp 98.3°F | Resp 18 | Ht 67.0 in | Wt 227.3 lb

## 2018-09-03 DIAGNOSIS — K50018 Crohn's disease of small intestine with other complication: Secondary | ICD-10-CM

## 2018-09-03 NOTE — Progress Notes (Signed)
Presenting complaint;  Follow-up for ileal Crohn's disease  Database and subjective:  Patient is 63 year old Caucasian male who has had diarrhea for years who was suspected to have Crohn's disease when he was hospitalized at [facility] him in May last year with signs and symptoms of small bowel obstruction.  Diagnosis was finally confirmed via colonoscopy in September 2019.  He has been on 6-MP for about 6 months.  He did develop mild bump in transaminases necessitating dose reduction of 6-MP with normalization of transaminases.  He was last seen on 05/28/2018.  He returns for follow-up visit.  He has no complaints.  He is having 2-3 bowel movements daily.  He has urgency every now and then.  He has not had any accidents since he was last seen.  He does not recall the last time he had formed stool.  He has not tried Imodium.  He has occasional fleeting abdominal pain.  His appetite is good.  His weight has been stable. He had planned blood work prior to the visit which is reviewed below.     Current Medications: Outpatient Encounter Medications as of 09/03/2018  Medication Sig  . atorvastatin (LIPITOR) 10 MG tablet Take 1 tablet (10 mg total) by mouth daily.  Marland Kitchen loperamide (IMODIUM) 2 MG capsule Take 1 capsule (2 mg total) by mouth daily as needed for diarrhea or loose stools.  . mercaptopurine (PURINETHOL) 50 MG tablet Take 1.5 tablets (75 mg total) by mouth daily. GIVE ON AN EMPTY STOMACH 1 HOUR BEFORE OR 2 HOURS AFTER MEALS.  . metFORMIN (GLUCOPHAGE) 500 MG tablet Take 500 mg by mouth daily.   . Multiple Vitamins-Minerals (MULTIVITAMIN ADULTS PO) Take by mouth daily.  Marland Kitchen VITAMIN D PO Take by mouth daily.  . [DISCONTINUED] Multiple Vitamin (MULTIVITAMIN) LIQD Take 5 mLs by mouth daily.   No facility-administered encounter medications on file as of 09/03/2018.      Objective: Blood pressure 126/79, pulse 65, temperature 98.3 F (36.8 C), temperature source Oral, resp. rate 18, height '5\' 7"'  (1.702  m), weight 227 lb 4.8 oz (103.1 kg). Patient is alert and in no acute distress. Conjunctiva is pink. Sclera is nonicteric Oropharyngeal mucosa is normal. No neck masses or thyromegaly noted. Cardiac exam with regular rhythm normal S1 and S2. No murmur or gallop noted. Lungs are clear to auscultation. Abdomen is full.  Bowel sounds are normal.  On palpation abdomen is soft and nontender without organomegaly or masses. No LE edema or clubbing noted.  Labs/studies Results:  CBC Latest Ref Rng & Units 08/28/2018 07/24/2018 05/27/2018  WBC 3.8 - 10.8 Thousand/uL 4.3 4.0 4.0  Hemoglobin 13.2 - 17.1 g/dL 14.7 15.1 14.6  Hematocrit 38.5 - 50.0 % 42.9 43.1 41.7  Platelets 140 - 400 Thousand/uL 237 238 253    CMP Latest Ref Rng & Units 08/28/2018 05/27/2018 03/22/2018  Glucose 65 - 139 mg/dL 112 - 162(H)  BUN 7 - 25 mg/dL 9 - 13  Creatinine 0.70 - 1.25 mg/dL 0.93 - 0.90  Sodium 135 - 146 mmol/L 142 - 140  Potassium 3.5 - 5.3 mmol/L 4.3 - 4.1  Chloride 98 - 110 mmol/L 106 - 103  CO2 20 - 32 mmol/L 29 - 30  Calcium 8.6 - 10.3 mg/dL 9.2 - 9.7  Total Protein 6.1 - 8.1 g/dL 6.7 6.5 6.8  Total Bilirubin 0.2 - 1.2 mg/dL 0.7 0.7 0.6  Alkaline Phos 38 - 126 U/L - - -  AST 10 - 35 U/L 23 21 21  ALT 9 - 46 U/L '23 20 22    ' Hepatic Function Latest Ref Rng & Units 08/28/2018 05/27/2018 03/22/2018  Total Protein 6.1 - 8.1 g/dL 6.7 6.5 6.8  Albumin 3.5 - 5.0 g/dL - - -  AST 10 - 35 U/L '23 21 21  ' ALT 9 - 46 U/L '23 20 22  ' Alk Phosphatase 38 - 126 U/L - - -  Total Bilirubin 0.2 - 1.2 mg/dL 0.7 0.7 0.6  Bilirubin, Direct 0.0 - 0.2 mg/dL - 0.1 -    Lab Results  Component Value Date   CRP 1.0 08/28/2018    CT and MR enterography images reviewed with patient.  Assessment:  #1.  Small bowel Crohn's disease.  He possibly has had disease for years but it was suspected in May 2019 and confirmed by a colonoscopy/ileoscopy in September 2019.  He is on single agent which is 6-MP which he is tolerating at a  current dose.  Clinically he appears to be in remission.  His CRP is low normal.  He is still having loose stools although frequency has decreased.  Diarrhea may or may not be due to inflammatory bowel disease. If diarrhea persists may consider repeating MR enterography at some point to determine disease activity.   Plan:  Continue 6-MP at a dose of 75 mg by mouth daily. Patient advised to try Imodium OTC 2 mg daily PRN. CBC with differential and comprehensive chemistry panel in 3 months. Office visit in 6 months.

## 2018-09-03 NOTE — Patient Instructions (Signed)
Next blood work in 3 months.  Office will remind you. Can take Imodium 2 mg daily on as-needed basis as discussed.

## 2018-09-14 DIAGNOSIS — R1031 Right lower quadrant pain: Secondary | ICD-10-CM | POA: Diagnosis not present

## 2018-09-14 DIAGNOSIS — K5730 Diverticulosis of large intestine without perforation or abscess without bleeding: Secondary | ICD-10-CM | POA: Diagnosis not present

## 2018-09-14 DIAGNOSIS — N132 Hydronephrosis with renal and ureteral calculous obstruction: Secondary | ICD-10-CM | POA: Diagnosis not present

## 2018-09-14 DIAGNOSIS — E119 Type 2 diabetes mellitus without complications: Secondary | ICD-10-CM | POA: Diagnosis not present

## 2018-09-14 DIAGNOSIS — Z885 Allergy status to narcotic agent status: Secondary | ICD-10-CM | POA: Diagnosis not present

## 2018-09-14 DIAGNOSIS — K5790 Diverticulosis of intestine, part unspecified, without perforation or abscess without bleeding: Secondary | ICD-10-CM | POA: Diagnosis not present

## 2018-09-14 DIAGNOSIS — Z7984 Long term (current) use of oral hypoglycemic drugs: Secondary | ICD-10-CM | POA: Diagnosis not present

## 2018-09-14 DIAGNOSIS — Z79899 Other long term (current) drug therapy: Secondary | ICD-10-CM | POA: Diagnosis not present

## 2018-09-14 DIAGNOSIS — Z87442 Personal history of urinary calculi: Secondary | ICD-10-CM | POA: Diagnosis not present

## 2018-09-16 ENCOUNTER — Ambulatory Visit (HOSPITAL_COMMUNITY): Payer: BC Managed Care – PPO | Admitting: Certified Registered Nurse Anesthetist

## 2018-09-16 ENCOUNTER — Inpatient Hospital Stay (HOSPITAL_COMMUNITY)
Admission: RE | Admit: 2018-09-16 | Discharge: 2018-09-18 | DRG: 661 | Disposition: A | Discharge status: Home / Self Care | Payer: BC Managed Care – PPO | Attending: Urology | Admitting: Urology

## 2018-09-16 ENCOUNTER — Encounter (HOSPITAL_COMMUNITY): Payer: Self-pay | Admitting: General Practice

## 2018-09-16 ENCOUNTER — Other Ambulatory Visit: Payer: Self-pay | Admitting: Urology

## 2018-09-16 ENCOUNTER — Ambulatory Visit (HOSPITAL_COMMUNITY): Payer: BC Managed Care – PPO

## 2018-09-16 ENCOUNTER — Other Ambulatory Visit (HOSPITAL_COMMUNITY)
Admission: RE | Admit: 2018-09-16 | Discharge: 2018-09-16 | Disposition: A | Discharge status: Home / Self Care | Payer: BC Managed Care – PPO | Source: Ambulatory Visit | Attending: Urology | Admitting: Urology

## 2018-09-16 ENCOUNTER — Encounter (HOSPITAL_COMMUNITY)
Admission: RE | Disposition: A | Discharge status: Home / Self Care | Payer: Self-pay | Source: Home / Self Care | Attending: Urology

## 2018-09-16 ENCOUNTER — Other Ambulatory Visit: Payer: Self-pay

## 2018-09-16 DIAGNOSIS — N201 Calculus of ureter: Secondary | ICD-10-CM | POA: Diagnosis present

## 2018-09-16 DIAGNOSIS — Z833 Family history of diabetes mellitus: Secondary | ICD-10-CM

## 2018-09-16 DIAGNOSIS — Z8546 Personal history of malignant neoplasm of prostate: Secondary | ICD-10-CM

## 2018-09-16 DIAGNOSIS — K5090 Crohn's disease, unspecified, without complications: Secondary | ICD-10-CM | POA: Diagnosis not present

## 2018-09-16 DIAGNOSIS — E7800 Pure hypercholesterolemia, unspecified: Secondary | ICD-10-CM | POA: Diagnosis not present

## 2018-09-16 DIAGNOSIS — Z800 Family history of malignant neoplasm of digestive organs: Secondary | ICD-10-CM

## 2018-09-16 DIAGNOSIS — Z6835 Body mass index (BMI) 35.0-35.9, adult: Secondary | ICD-10-CM | POA: Diagnosis not present

## 2018-09-16 DIAGNOSIS — E669 Obesity, unspecified: Secondary | ICD-10-CM | POA: Diagnosis not present

## 2018-09-16 DIAGNOSIS — N3000 Acute cystitis without hematuria: Secondary | ICD-10-CM | POA: Diagnosis not present

## 2018-09-16 DIAGNOSIS — Z1159 Encounter for screening for other viral diseases: Secondary | ICD-10-CM | POA: Insufficient documentation

## 2018-09-16 DIAGNOSIS — R509 Fever, unspecified: Secondary | ICD-10-CM | POA: Diagnosis not present

## 2018-09-16 DIAGNOSIS — N136 Pyonephrosis: Secondary | ICD-10-CM | POA: Diagnosis not present

## 2018-09-16 DIAGNOSIS — B964 Proteus (mirabilis) (morganii) as the cause of diseases classified elsewhere: Secondary | ICD-10-CM | POA: Diagnosis not present

## 2018-09-16 DIAGNOSIS — N390 Urinary tract infection, site not specified: Secondary | ICD-10-CM | POA: Diagnosis not present

## 2018-09-16 DIAGNOSIS — N202 Calculus of kidney with calculus of ureter: Secondary | ICD-10-CM | POA: Diagnosis not present

## 2018-09-16 DIAGNOSIS — N132 Hydronephrosis with renal and ureteral calculous obstruction: Secondary | ICD-10-CM | POA: Diagnosis not present

## 2018-09-16 DIAGNOSIS — C61 Malignant neoplasm of prostate: Secondary | ICD-10-CM | POA: Diagnosis not present

## 2018-09-16 DIAGNOSIS — Z87442 Personal history of urinary calculi: Secondary | ICD-10-CM | POA: Diagnosis not present

## 2018-09-16 DIAGNOSIS — K219 Gastro-esophageal reflux disease without esophagitis: Secondary | ICD-10-CM | POA: Diagnosis not present

## 2018-09-16 DIAGNOSIS — E119 Type 2 diabetes mellitus without complications: Secondary | ICD-10-CM | POA: Diagnosis not present

## 2018-09-16 DIAGNOSIS — K56609 Unspecified intestinal obstruction, unspecified as to partial versus complete obstruction: Secondary | ICD-10-CM | POA: Diagnosis not present

## 2018-09-16 DIAGNOSIS — N200 Calculus of kidney: Secondary | ICD-10-CM | POA: Diagnosis not present

## 2018-09-16 HISTORY — PX: CYSTOSCOPY W/ URETERAL STENT PLACEMENT: SHX1429

## 2018-09-16 LAB — CBC
HCT: 43.2 % (ref 39.0–52.0)
Hemoglobin: 14.9 g/dL (ref 13.0–17.0)
MCH: 32.5 pg (ref 26.0–34.0)
MCHC: 34.5 g/dL (ref 30.0–36.0)
MCV: 94.3 fL (ref 80.0–100.0)
Platelets: 149 10*3/uL — ABNORMAL LOW (ref 150–400)
RBC: 4.58 MIL/uL (ref 4.22–5.81)
RDW: 14.7 % (ref 11.5–15.5)
WBC: 10.3 10*3/uL (ref 4.0–10.5)
nRBC: 0 % (ref 0.0–0.2)

## 2018-09-16 LAB — BASIC METABOLIC PANEL
Anion gap: 13 (ref 5–15)
BUN: 22 mg/dL (ref 8–23)
CO2: 21 mmol/L — ABNORMAL LOW (ref 22–32)
Calcium: 8.8 mg/dL — ABNORMAL LOW (ref 8.9–10.3)
Chloride: 103 mmol/L (ref 98–111)
Creatinine, Ser: 1.89 mg/dL — ABNORMAL HIGH (ref 0.61–1.24)
GFR calc Af Amer: 43 mL/min — ABNORMAL LOW (ref >60)
GFR calc non Af Amer: 37 mL/min — ABNORMAL LOW (ref >60)
Glucose, Bld: 145 mg/dL — ABNORMAL HIGH (ref 70–99)
Potassium: 3.8 mmol/L (ref 3.5–5.1)
Sodium: 137 mmol/L (ref 135–145)

## 2018-09-16 LAB — GLUCOSE, CAPILLARY
Glucose-Capillary: 137 mg/dL — ABNORMAL HIGH (ref 70–99)
Glucose-Capillary: 144 mg/dL — ABNORMAL HIGH (ref 70–99)
Glucose-Capillary: 148 mg/dL — ABNORMAL HIGH (ref 70–99)

## 2018-09-16 LAB — SARS CORONAVIRUS 2 BY RT PCR (HOSPITAL ORDER, PERFORMED IN ~~LOC~~ HOSPITAL LAB): SARS Coronavirus 2: NEGATIVE

## 2018-09-16 SURGERY — CYSTOSCOPY, WITH RETROGRADE PYELOGRAM AND URETERAL STENT INSERTION
Anesthesia: General | Site: Ureter | Laterality: Right

## 2018-09-16 MED ORDER — LIDOCAINE 2% (20 MG/ML) 5 ML SYRINGE
INTRAMUSCULAR | Status: DC | PRN
  Administered 2018-09-16: 80 mg via INTRAVENOUS

## 2018-09-16 MED ORDER — PROPOFOL 10 MG/ML IV BOLUS
INTRAVENOUS | Status: DC | PRN
  Administered 2018-09-16: 160 mg via INTRAVENOUS

## 2018-09-16 MED ORDER — SODIUM CHLORIDE 0.9 % IR SOLN
Status: DC | PRN
  Administered 2018-09-16: 3000 mL

## 2018-09-16 MED ORDER — DEXAMETHASONE SODIUM PHOSPHATE 10 MG/ML IJ SOLN
INTRAMUSCULAR | Status: DC | PRN
  Administered 2018-09-16: 5 mg via INTRAVENOUS

## 2018-09-16 MED ORDER — CIPROFLOXACIN IN D5W 400 MG/200ML IV SOLN
400.0000 mg | Freq: Once | INTRAVENOUS | Status: AC
  Administered 2018-09-16: 400 mg via INTRAVENOUS
  Filled 2018-09-16: qty 200

## 2018-09-16 MED ORDER — DIPHENHYDRAMINE HCL 12.5 MG/5ML PO ELIX: 12.5000 mg | ORAL_SOLUTION | Freq: Four times a day (QID) | ORAL | Status: DC | PRN

## 2018-09-16 MED ORDER — IOHEXOL 300 MG/ML  SOLN
INTRAMUSCULAR | Status: DC | PRN
  Administered 2018-09-16: 10 mL

## 2018-09-16 MED ORDER — LIDOCAINE 2% (20 MG/ML) 5 ML SYRINGE
INTRAMUSCULAR | Status: AC
  Filled 2018-09-16: qty 5

## 2018-09-16 MED ORDER — INSULIN ASPART 100 UNIT/ML ~~LOC~~ SOLN: 0.0000 [IU] | Freq: Every day | SUBCUTANEOUS | Status: DC

## 2018-09-16 MED ORDER — ONDANSETRON HCL 4 MG/2ML IJ SOLN: 4.0000 mg | INTRAMUSCULAR | Status: DC | PRN

## 2018-09-16 MED ORDER — ACETAMINOPHEN 325 MG PO TABS: 650.0000 mg | ORAL_TABLET | ORAL | Status: DC | PRN

## 2018-09-16 MED ORDER — SODIUM CHLORIDE 0.9 % IV SOLN
INTRAVENOUS | Status: DC
  Administered 2018-09-16: 21:00:00 via INTRAVENOUS

## 2018-09-16 MED ORDER — ACETAMINOPHEN 500 MG PO TABS
ORAL_TABLET | ORAL | Status: AC
  Filled 2018-09-16: qty 2

## 2018-09-16 MED ORDER — DIPHENHYDRAMINE HCL 50 MG/ML IJ SOLN: 12.5000 mg | Freq: Four times a day (QID) | INTRAMUSCULAR | Status: DC | PRN

## 2018-09-16 MED ORDER — PHENYLEPHRINE HCL (PRESSORS) 10 MG/ML IV SOLN
INTRAVENOUS | Status: DC | PRN
  Administered 2018-09-16: 80 ug via INTRAVENOUS

## 2018-09-16 MED ORDER — MIDAZOLAM HCL 2 MG/2ML IJ SOLN
INTRAMUSCULAR | Status: AC
  Filled 2018-09-16: qty 2

## 2018-09-16 MED ORDER — HYDROMORPHONE HCL 1 MG/ML IJ SOLN: 0.5000 mg | INTRAMUSCULAR | Status: DC | PRN

## 2018-09-16 MED ORDER — ACETAMINOPHEN 10 MG/ML IV SOLN
INTRAVENOUS | Status: AC
  Filled 2018-09-16: qty 100

## 2018-09-16 MED ORDER — ACETAMINOPHEN 500 MG PO TABS
1000.0000 mg | ORAL_TABLET | Freq: Once | ORAL | Status: AC
  Administered 2018-09-16: 1000 mg via ORAL

## 2018-09-16 MED ORDER — FENTANYL CITRATE (PF) 100 MCG/2ML IJ SOLN
INTRAMUSCULAR | Status: AC
  Filled 2018-09-16: qty 2

## 2018-09-16 MED ORDER — ORAL CARE MOUTH RINSE
15.0000 mL | Freq: Two times a day (BID) | OROMUCOSAL | Status: DC
  Administered 2018-09-16 – 2018-09-17 (×3): 15 mL via OROMUCOSAL

## 2018-09-16 MED ORDER — OXYBUTYNIN CHLORIDE 5 MG PO TABS: 5.0000 mg | ORAL_TABLET | Freq: Three times a day (TID) | ORAL | Status: DC | PRN

## 2018-09-16 MED ORDER — MIDAZOLAM HCL 5 MG/5ML IJ SOLN
INTRAMUSCULAR | Status: DC | PRN
  Administered 2018-09-16: 2 mg via INTRAVENOUS

## 2018-09-16 MED ORDER — CIPROFLOXACIN IN D5W 400 MG/200ML IV SOLN
400.0000 mg | Freq: Two times a day (BID) | INTRAVENOUS | Status: DC
  Administered 2018-09-17: 400 mg via INTRAVENOUS
  Filled 2018-09-16: qty 200

## 2018-09-16 MED ORDER — HYDROCODONE-ACETAMINOPHEN 5-325 MG PO TABS: 1.0000 | ORAL_TABLET | ORAL | Status: DC | PRN

## 2018-09-16 MED ORDER — LACTATED RINGERS IV SOLN
INTRAVENOUS | Status: DC
  Administered 2018-09-16: 17:00:00 via INTRAVENOUS

## 2018-09-16 MED ORDER — INSULIN ASPART 100 UNIT/ML ~~LOC~~ SOLN
0.0000 [IU] | Freq: Three times a day (TID) | SUBCUTANEOUS | Status: DC
  Administered 2018-09-17 – 2018-09-18 (×4): 2 [IU] via SUBCUTANEOUS

## 2018-09-16 MED ORDER — FENTANYL CITRATE (PF) 100 MCG/2ML IJ SOLN
INTRAMUSCULAR | Status: DC | PRN
  Administered 2018-09-16: 50 ug via INTRAVENOUS

## 2018-09-16 MED ORDER — FENTANYL CITRATE (PF) 100 MCG/2ML IJ SOLN: 25.0000 ug | INTRAMUSCULAR | Status: DC | PRN

## 2018-09-16 MED ORDER — ATORVASTATIN CALCIUM 10 MG PO TABS
10.0000 mg | ORAL_TABLET | Freq: Every day | ORAL | Status: DC
  Administered 2018-09-16 – 2018-09-18 (×3): 10 mg via ORAL
  Filled 2018-09-16 (×3): qty 1

## 2018-09-16 MED ORDER — PROMETHAZINE HCL 25 MG/ML IJ SOLN: 6.2500 mg | INTRAMUSCULAR | Status: DC | PRN

## 2018-09-16 MED ORDER — MERCAPTOPURINE 50 MG PO TABS
75.0000 mg | ORAL_TABLET | Freq: Every day | ORAL | Status: DC
  Filled 2018-09-16: qty 2

## 2018-09-16 MED ORDER — BELLADONNA ALKALOIDS-OPIUM 16.2-60 MG RE SUPP: 1.0000 | Freq: Four times a day (QID) | RECTAL | Status: DC | PRN

## 2018-09-16 MED ORDER — ONDANSETRON HCL 4 MG/2ML IJ SOLN
INTRAMUSCULAR | Status: DC | PRN
  Administered 2018-09-16: 4 mg via INTRAVENOUS

## 2018-09-16 SURGICAL SUPPLY — 15 items
BAG URO CATCHER STRL LF (MISCELLANEOUS) ×2 IMPLANT
CATH URET 5FR 28IN OPEN ENDED (CATHETERS) ×2 IMPLANT
CLOTH BEACON ORANGE TIMEOUT ST (SAFETY) ×2 IMPLANT
GLOVE BIOGEL M STRL SZ7.5 (GLOVE) ×2 IMPLANT
GOWN STRL REUS W/TWL LRG LVL3 (GOWN DISPOSABLE) ×4 IMPLANT
GUIDEWIRE STR DUAL SENSOR (WIRE) IMPLANT
GUIDEWIRE ZIPWRE .038 STRAIGHT (WIRE) ×2 IMPLANT
KIT TURNOVER KIT A (KITS) IMPLANT
MANIFOLD NEPTUNE II (INSTRUMENTS) ×2 IMPLANT
PACK CYSTO (CUSTOM PROCEDURE TRAY) ×2 IMPLANT
STENT URET 6FRX24 CONTOUR (STENTS) ×2 IMPLANT
STENT URET 6FRX26 CONTOUR (STENTS) ×2 IMPLANT
TRAY FOLEY MTR SLVR 16FR STAT (SET/KITS/TRAYS/PACK) ×2 IMPLANT
TUBING CONNECTING 10 (TUBING) ×2 IMPLANT
TUBING UROLOGY SET (TUBING) IMPLANT

## 2018-09-16 NOTE — Plan of Care (Signed)

## 2018-09-16 NOTE — Transfer of Care (Signed)
Immediate Anesthesia Transfer of Care Note  Patient: Brett Trevino  Procedure(s) Performed: CYSTOSCOPY WITH RETROGRADE PYELOGRAM/URETERAL STENT PLACEMENT (Right Ureter)  Patient Location: PACU  Anesthesia Type:General  Level of Consciousness: sedated, patient cooperative and responds to stimulation  Airway & Oxygen Therapy: Patient Spontanous Breathing and Patient connected to face mask oxygen  Post-op Assessment: Report given to RN and Post -op Vital signs reviewed and stable  Post vital signs: Reviewed and stable  Last Vitals:  Vitals Value Taken Time  BP 121/68 09/16/18 1901  Temp    Pulse 93 09/16/18 1902  Resp 20 09/16/18 1902  SpO2 99 % 09/16/18 1902  Vitals shown include unvalidated device data.  Last Pain:  Vitals:   09/16/18 1701  TempSrc: Oral  PainSc: 5          Complications: No apparent anesthesia complications

## 2018-09-16 NOTE — Progress Notes (Signed)
Patient given PO tylenol 1000mg  per verbal order from Dr. Fransisco Beau for fever. Patient was able to take medication with sips of water without any difficulty. Patient VS stable, denies pain, NAD at this time.

## 2018-09-16 NOTE — Anesthesia Procedure Notes (Signed)
Procedure Name: LMA Insertion Date/Time: 09/16/2018 6:23 PM Performed by: West Pugh, CRNA Pre-anesthesia Checklist: Patient identified, Emergency Drugs available, Suction available, Patient being monitored and Timeout performed Patient Re-evaluated:Patient Re-evaluated prior to induction Oxygen Delivery Method: Circle system utilized Preoxygenation: Pre-oxygenation with 100% oxygen Induction Type: IV induction Ventilation: Mask ventilation without difficulty LMA: LMA with gastric port inserted LMA Size: 5.0 Placement Confirmation: positive ETCO2 and breath sounds checked- equal and bilateral Tube secured with: Tape Dental Injury: Teeth and Oropharynx as per pre-operative assessment

## 2018-09-16 NOTE — Anesthesia Preprocedure Evaluation (Deleted)
Anesthesia Evaluation    Reviewed: Allergy & Precautions, Patient's Chart, lab work & pertinent test results  History of Anesthesia Complications (+) PROLONGED EMERGENCE and history of anesthetic complications  Airway        Dental   Pulmonary neg pulmonary ROS,           Cardiovascular negative cardio ROS       Neuro/Psych negative neurological ROS  negative psych ROS   GI/Hepatic Neg liver ROS,  Crohn's disease    Endo/Other  diabetes, Type 2, Oral Hypoglycemic Agents Obesity   Renal/GU negative Renal ROS    Prostate cancer     Musculoskeletal negative musculoskeletal ROS (+)   Abdominal   Peds  Hematology negative hematology ROS (+)   Anesthesia Other Findings   Reproductive/Obstetrics                             Anesthesia Physical Anesthesia Plan  ASA: II  Anesthesia Plan: General   Post-op Pain Management:    Induction: Intravenous  PONV Risk Score and Plan: 2 and Treatment may vary due to age or medical condition, Ondansetron and Dexamethasone  Airway Management Planned: LMA  Additional Equipment: None  Intra-op Plan:   Post-operative Plan: Extubation in OR  Informed Consent:   Plan Discussed with: CRNA and Anesthesiologist  Anesthesia Plan Comments:         Anesthesia Quick Evaluation

## 2018-09-16 NOTE — Anesthesia Preprocedure Evaluation (Addendum)
Anesthesia Evaluation  Patient identified by MRN, date of birth, ID band Patient awake    Reviewed: Allergy & Precautions, NPO status , Patient's Chart, lab work & pertinent test results  History of Anesthesia Complications Negative for: history of anesthetic complications  Airway Mallampati: II  TM Distance: >3 FB Neck ROM: Full    Dental  (+) Dental Advisory Given   Pulmonary neg pulmonary ROS,    breath sounds clear to auscultation       Cardiovascular negative cardio ROS   Rhythm:Regular Rate:Normal     Neuro/Psych negative neurological ROS  negative psych ROS   GI/Hepatic Neg liver ROS,  Crohn's disease    Endo/Other  diabetes, Type 2, Oral Hypoglycemic Agents Obesity   Renal/GU  Kidney stones     Prostate cancer     Musculoskeletal negative musculoskeletal ROS (+)   Abdominal   Peds  Hematology negative hematology ROS (+)   Anesthesia Other Findings   Reproductive/Obstetrics                            Anesthesia Physical Anesthesia Plan  ASA: II  Anesthesia Plan: General   Post-op Pain Management:    Induction: Intravenous  PONV Risk Score and Plan: 2 and Treatment may vary due to age or medical condition and Ondansetron  Airway Management Planned: LMA  Additional Equipment: None  Intra-op Plan:   Post-operative Plan: Extubation in OR  Informed Consent: I have reviewed the patients History and Physical, chart, labs and discussed the procedure including the risks, benefits and alternatives for the proposed anesthesia with the patient or authorized representative who has indicated his/her understanding and acceptance.     Dental advisory given  Plan Discussed with: CRNA and Anesthesiologist  Anesthesia Plan Comments:        Anesthesia Quick Evaluation

## 2018-09-16 NOTE — Op Note (Signed)
Operative Note  Preoperative diagnosis:  1.  Right 8 mm UPJ stone 2.  Urinary tract infection  Postoperative diagnosis: 1.  Right 8 mm UPJ stone 2.  Urinary tract infection  Procedure(s): 1.  Cystoscopy with right JJ stent placement 2.  Right retrograde pyelogram with intraoperative interpretation of fluoroscopic imaging  Surgeon: Ellison Hughs, MD  Assistants:  None  Anesthesia:  General  Complications:  None  EBL: Less than 5 mL  Specimens: 1.  Urine for culture and sensitivity  Drains/Catheters: 1.  Right 6 French, 26 cm JJ stent without tether 2.  16 French Foley catheter with 10 mL in the balloon  Intraoperative findings:   1. Right retrograde pyelogram revealed a dilated right proximal ureter with no distal filling defects.  The right renal pelvis was only partially opacified out of concern for causing pyelovenous backflow in the setting of an obstructing stone and UTI.  Only the upper pole calyces were opacified with contrast and showed no filling defects. 2.   Purulent urine was seen draining from the right ureteral orifice following right JJ stent placement  Indication:  Brett Trevino is a 63 y.o. male with an 8 mm right UPJ calculus that was seen on CT stone study from 09/14/2018.  The patient had a subjective fever of 102.7 F at home yesterday as well as continued general malaise and flank pain.  Urinalysis in the office revealed evidence of acute cystitis, prompting emergent right ureteral stent placement.  The patient has been consented for the above procedures, voices understanding and wishes to proceed.  Description of procedure:  After informed consent was obtained, the patient was brought to the operating room and general LMA anesthesia was administered. The patient was then placed in the dorsolithotomy position and prepped and draped in the usual sterile fashion. A timeout was performed. A 23 French rigid cystoscope was then inserted into the urethral  meatus and advanced into the bladder under direct vision. A complete bladder survey revealed no intravesical pathology.  A 5 French ureteral catheter was then inserted into the right ureteral orifice and a retrograde pyelogram was obtained, with the findings listed above.  A Glidewire was then advanced through the lumen of the ureteral catheter and advanced up to the right renal pelvis, under fluoroscopic guidance.  A 6 French, 24 cm JJ stent was then placed over the wire and into position within the right collecting system, however, the proximal curl of the stent migrated into the UPJ.  I then removed the right-sided stent and replaced the Glidewire, which was advanced up to the right renal pelvis, under fluoroscopic guidance.  A 6 French, 26 cm JJ stent was then placed over the wire and into good position within the right collecting system, confirming placement via fluoroscopy.  He had a good proximal and distal curl with the 26 cm stent.  Following stent placement, the patient was noted to have a significant amount of purulent urine draining through the stent and from the right ureteral orifice.  A urine specimen was obtained for culture and sensitivity.  A 16 French Foley catheter was then inserted into the bladder and placed to gravity drainage.  The patient tolerated the procedure well and was transferred to the postanesthesia in stable condition.  Plan: Monitor the patient on the floor overnight with repeat labs in the morning.  Likely voiding trial tomorrow morning.

## 2018-09-16 NOTE — H&P (Signed)
Pre-op H&P  CC: I have kidney stones.  HPI: Brett Trevino is a 63 year-old male established patient who is here for renal calculi.  The problem is on the right side. He first stated noticing pain on 09/14/2018. This is not his first kidney stone. He has had 1 stones prior to getting this one. He is currently having flank pain and nausea. He denies having back pain, groin pain, vomiting, fever, and chills. He has not caught a stone in his urine strainer since his symptoms began.   He has never had surgical treatment for calculi in the past.   The patient is a 63 year old male who presented to the emergency department on 09/14/18 with worsening right-sided flank pain associated with nausea and vomiting. He also reports a one-time temperature of 102F last night that resolved after a dose of Tylenol. He is afebrile and his vital signs are stable in the office today.   -He does have a prior history of kidney stones (passed a single stone 4-5 years ago).  -Recently underwent RALP with Dr. Alinda Money in 2019   Labs 09/14/18  Serum creatinine-0.5  WBC-7.5  Urinalysis showed microscopic hematuria, but was otherwise unremarkable   CT stone study  Impression:  1. 8 mm obstructive stone at the right UPJ with secondary mild right hydronephrosis  2. No other radiopaque calculi identified within either kidney or along the course of either renal collecting system  3. No other acute intra-abdominal or pelvic process  4. Mild colonic diverticulosis without evidence of acute diverticulitis     ALLERGIES: None   MEDICATIONS: Aspirin  Metformin Hcl  Atorvastatin Calcium  Magnesium  Multivitamin  Naproxen 500 mg tablet  Ondansetron Odt 4 mg tablet,disintegrating  Promethazine Hcl 25 mg tablet 1 tablet PO Q 6 H  Vitamin B12     GU PSH: Laparoscopy; Lymphadenectomy - 12/06/2017 Robotic Radical Prostatectomy - 12/06/2017    NON-GU PSH: None   GU PMH: Stress Incontinence - 12/12/2017 Prostate Cancer, T1c  N0 M0 Gleason 8 high risk prostate cancer with severe LUTs and a 65ml prostate. I have discussed surgery vs radiation therapy with 2 years of ADT. With his LUTS, I believe he will be best served with primary surgical therapy, but if he is interested in XRT, I would want to reassess voiding after 2-3 months of ADT prior to beginning ADT. After reviewing the options he would like to be referred for Prostatectomy. - 10/26/2017 ED due to arterial insufficiency, He has progressive severe ED. We can address that in the future if he desires. - 08/31/2017      PMH Notes:   1) Prostate cancer: He is s/p a UNS RAL radical prostatectomy and BPLND on 12/06/17.   Diagnosis:pT3a N0 Mx, Gleason 5+4=9 adenocarcinoma with positive margin (4 mm, Gleason 9 at left bladder neck in area of EPE)  Pretreatment PSA: 17.8  Pretreatment SHIM score: 6   NON-GU PMH: Muscle weakness (generalized) - 11/09/2017 Crohns Disease Diabetes Type 2 GERD Hypercholesterolemia    FAMILY HISTORY: Colon Cancer - Runs in Family Diabetes - Runs in Family heart - Runs in Family   SOCIAL HISTORY: Marital Status: Married Preferred Language: English Current Smoking Status: Patient has never smoked.   Tobacco Use Assessment Completed: Used Tobacco in last 30 days? Has never drank.  Does not drink caffeine. Patient's occupation Engineer, maintenance.    REVIEW OF SYSTEMS:    GU Review Male:   Patient reports frequent urination, hard to postpone urination, burning/ pain  with urination, get up at night to urinate, and leakage of urine. Patient denies stream starts and stops, trouble starting your stream, have to strain to urinate , erection problems, and penile pain.  Gastrointestinal (Upper):   Patient reports nausea and vomiting. Patient denies indigestion/ heartburn.  Gastrointestinal (Lower):   Patient denies diarrhea and constipation.  Constitutional:   Patient reports fever and fatigue. Patient denies night sweats and weight loss.  Skin:    Patient denies skin rash/ lesion and itching.  Eyes:   Patient denies blurred vision and double vision.  Ears/ Nose/ Throat:   Patient denies sore throat and sinus problems.  Hematologic/Lymphatic:   Patient denies swollen glands and easy bruising.  Cardiovascular:   Patient denies leg swelling and chest pains.  Respiratory:   Patient denies cough and shortness of breath.  Endocrine:   Patient denies excessive thirst.  Musculoskeletal:   Patient reports back pain. Patient denies joint pain.  Neurological:   Patient denies headaches and dizziness.  Psychologic:   Patient denies depression and anxiety.   Notes: Fever 102.7 yesterday. Right side low back and flank pain.     VITAL SIGNS:      09/16/2018 01:46 PM  Weight 227 lb / 102.97 kg  Height 67 in / 170.18 cm  BP 118/72 mmHg  Pulse 92 /min  Temperature 98.6 F / 37 C  BMI 35.5 kg/m   MULTI-SYSTEM PHYSICAL EXAMINATION:    Constitutional: Well-nourished. No physical deformities. Normally developed. Good grooming. Appears very uncomfortable.   Neck: Neck symmetrical, not swollen. Normal tracheal position.  Respiratory: No labored breathing, no use of accessory muscles.   Cardiovascular: Normal temperature, normal extremity pulses, no swelling, no varicosities.  Skin: No paleness, no jaundice, no cyanosis. No lesion, no ulcer, no rash.  Neurologic / Psychiatric: Oriented to time, oriented to place, oriented to person. No depression, no anxiety, no agitation.  Gastrointestinal: No mass, no tenderness, no rigidity, non obese abdomen.  Eyes: Normal conjunctivae. Normal eyelids.  Musculoskeletal: Normal gait and station of head and neck.     PAST DATA REVIEWED:  Source Of History:  Patient   06/10/18 02/26/18 08/31/17 07/05/17  PSA  Total PSA <0.015 ng/mL <0.015 ng/mL 17.8 ng/dl 13 ng/dl    PROCEDURES:          Urinalysis w/Scope Dipstick Dipstick Cont'd Micro  Color: Amber Bilirubin: Neg mg/dL WBC/hpf: >60/hpf  Appearance:  Cloudy Ketones: Trace mg/dL RBC/hpf: 3 - 10/hpf  Specific Gravity: 1.015 Blood: 3+ ery/uL Bacteria: Many (>50/hpf)  pH: 6.5 Protein: 2+ mg/dL Cystals: Amorph Urates  Glucose: Neg mg/dL Urobilinogen: 0.2 mg/dL Casts: NS (Not Seen)    Nitrites: Neg Trichomonas: Not Present    Leukocyte Esterase: 3+ leu/uL Mucous: Present      Epithelial Cells: 0 - 5/hpf      Yeast: NS (Not Seen)      Sperm: Not Present         Phenergan 25mg  - [id number], [id number] Qty: 12.5 Adm. By: Verlee Monte  Unit: mg Lot No [id number]  Route: IM Exp. Date 10/02/2019  Freq: None Mfgr.:   Site: Right Buttock   ASSESSMENT:      ICD-10 Details  1 GU:   Ureteral calculus - N20.1 8 mm right UPJ  2   Acute Cystitis/UTI - N30.00    PLAN:            Medications New Meds: Keflex 500 mg capsule 1 capsule PO TID   #21  0 Refill(s)  Orders Labs Urine Culture          Schedule Return Visit/Planned Activity: ASAP - Schedule Surgery          Document Letter(s):  Created for Patient: Clinical Summary         Notes:   -Urine culture and sensitivity pending  -Given the obstructive nature of his right ureteral calculus, UA evidence of acute cystitis as well as the fact that he had a febrile episode last night, the patient has been consented for emergent cystoscopy right JJ stent placement.   -The risks, benefits and alternatives of cystoscopy with RIGHT JJ stent placement was discussed with the patient. Risks include, but are not limited to: bleeding, urinary tract infection, ureteral injury, ureteral stricture disease, chronic pain, urinary symptoms, bladder injury, stent migration, the need for nephrostomy tube placement, MI, CVA, DVT, PE and the inherent risks with general anesthesia. The patient voices understanding and wishes to proceed.

## 2018-09-17 ENCOUNTER — Observation Stay (HOSPITAL_COMMUNITY): Payer: BC Managed Care – PPO

## 2018-09-17 ENCOUNTER — Encounter (HOSPITAL_COMMUNITY): Payer: Self-pay | Admitting: Urology

## 2018-09-17 DIAGNOSIS — K219 Gastro-esophageal reflux disease without esophagitis: Secondary | ICD-10-CM | POA: Diagnosis present

## 2018-09-17 DIAGNOSIS — N136 Pyonephrosis: Secondary | ICD-10-CM | POA: Diagnosis present

## 2018-09-17 DIAGNOSIS — Z6835 Body mass index (BMI) 35.0-35.9, adult: Secondary | ICD-10-CM | POA: Diagnosis not present

## 2018-09-17 DIAGNOSIS — N201 Calculus of ureter: Secondary | ICD-10-CM | POA: Diagnosis present

## 2018-09-17 DIAGNOSIS — Z800 Family history of malignant neoplasm of digestive organs: Secondary | ICD-10-CM | POA: Diagnosis not present

## 2018-09-17 DIAGNOSIS — E7800 Pure hypercholesterolemia, unspecified: Secondary | ICD-10-CM | POA: Diagnosis present

## 2018-09-17 DIAGNOSIS — E669 Obesity, unspecified: Secondary | ICD-10-CM | POA: Diagnosis present

## 2018-09-17 DIAGNOSIS — E119 Type 2 diabetes mellitus without complications: Secondary | ICD-10-CM | POA: Diagnosis present

## 2018-09-17 DIAGNOSIS — Z833 Family history of diabetes mellitus: Secondary | ICD-10-CM | POA: Diagnosis not present

## 2018-09-17 DIAGNOSIS — Z8546 Personal history of malignant neoplasm of prostate: Secondary | ICD-10-CM | POA: Diagnosis not present

## 2018-09-17 DIAGNOSIS — N202 Calculus of kidney with calculus of ureter: Secondary | ICD-10-CM | POA: Diagnosis not present

## 2018-09-17 DIAGNOSIS — Z87442 Personal history of urinary calculi: Secondary | ICD-10-CM | POA: Diagnosis not present

## 2018-09-17 DIAGNOSIS — N390 Urinary tract infection, site not specified: Secondary | ICD-10-CM | POA: Diagnosis not present

## 2018-09-17 DIAGNOSIS — R509 Fever, unspecified: Secondary | ICD-10-CM | POA: Diagnosis not present

## 2018-09-17 LAB — PSA: Prostatic Specific Antigen: 0.01 ng/mL (ref 0.00–4.00)

## 2018-09-17 LAB — CBC
HCT: 39 % (ref 39.0–52.0)
Hemoglobin: 13 g/dL (ref 13.0–17.0)
MCH: 32 pg (ref 26.0–34.0)
MCHC: 33.3 g/dL (ref 30.0–36.0)
MCV: 96.1 fL (ref 80.0–100.0)
Platelets: 119 10*3/uL — ABNORMAL LOW (ref 150–400)
RBC: 4.06 MIL/uL — ABNORMAL LOW (ref 4.22–5.81)
RDW: 14.8 % (ref 11.5–15.5)
WBC: 9.3 10*3/uL (ref 4.0–10.5)
nRBC: 0 % (ref 0.0–0.2)

## 2018-09-17 LAB — GLUCOSE, CAPILLARY
Glucose-Capillary: 140 mg/dL — ABNORMAL HIGH (ref 70–99)
Glucose-Capillary: 148 mg/dL — ABNORMAL HIGH (ref 70–99)
Glucose-Capillary: 127 mg/dL — ABNORMAL HIGH (ref 70–99)
Glucose-Capillary: 133 mg/dL — ABNORMAL HIGH (ref 70–99)

## 2018-09-17 LAB — BASIC METABOLIC PANEL
Anion gap: 9 (ref 5–15)
BUN: 27 mg/dL — ABNORMAL HIGH (ref 8–23)
CO2: 22 mmol/L (ref 22–32)
Calcium: 8.4 mg/dL — ABNORMAL LOW (ref 8.9–10.3)
Chloride: 107 mmol/L (ref 98–111)
Creatinine, Ser: 1.67 mg/dL — ABNORMAL HIGH (ref 0.61–1.24)
GFR calc Af Amer: 49 mL/min — ABNORMAL LOW (ref >60)
GFR calc non Af Amer: 43 mL/min — ABNORMAL LOW (ref >60)
Glucose, Bld: 190 mg/dL — ABNORMAL HIGH (ref 70–99)
Potassium: 4.5 mmol/L (ref 3.5–5.1)
Sodium: 138 mmol/L (ref 135–145)

## 2018-09-17 MED ORDER — SODIUM CHLORIDE 0.9 % IV SOLN
1.0000 g | INTRAVENOUS | Status: DC
  Administered 2018-09-17 – 2018-09-18 (×2): 1 g via INTRAVENOUS
  Filled 2018-09-17 (×2): qty 1

## 2018-09-17 NOTE — Anesthesia Postprocedure Evaluation (Signed)
Anesthesia Post Note  Patient: Brett Trevino  Procedure(s) Performed: CYSTOSCOPY WITH RETROGRADE PYELOGRAM/URETERAL STENT PLACEMENT (Right Ureter)     Patient location during evaluation: PACU Anesthesia Type: General Level of consciousness: awake and alert Pain management: pain level controlled Vital Signs Assessment: post-procedure vital signs reviewed and stable Respiratory status: spontaneous breathing, nonlabored ventilation, respiratory function stable and patient connected to nasal cannula oxygen Cardiovascular status: blood pressure returned to baseline and stable Postop Assessment: no apparent nausea or vomiting Anesthetic complications: no    Last Vitals:  Vitals:   09/17/18 1305 09/17/18 2038  BP: 119/70 136/71  Pulse: 66 91  Resp:  20  Temp: 36.9 C 37.6 C  SpO2: 95% 95%    Last Pain:  Vitals:   09/17/18 2038  TempSrc: Oral  PainSc:                  Audry Pili

## 2018-09-17 NOTE — Plan of Care (Signed)

## 2018-09-17 NOTE — Progress Notes (Signed)
Patient ID: Brett Trevino, male   DOB: 05/18/1955, 63 y.o.   MRN: [id number]  1 Day Post-Op Subjective: Pt s/p ureteral stent last night for obstructing UPJ stone.  Purulent drainage noted by Dr. Lovena Neighbours at the time of stent placement and postoperative fever noted.  He was therefore admitted for continued IV antibiotics.  Objective: Vital signs in last 24 hours: Temp:  [97.6 F (36.4 C)-101.2 F (38.4 C)] 97.6 F (36.4 C) (06/16 0507) Pulse Rate:  [57-121] 57 (06/16 0507) Resp:  [14-24] 16 (06/16 0507) BP: (103-145)/(63-70) 103/63 (06/16 0507) SpO2:  [94 %-100 %] 94 % (06/16 0507) Weight:  [103.7 kg-103.9 kg] 103.9 kg (06/15 2040)  Intake/Output from previous day: 06/15 0701 - 06/16 0700 In: 1818.2 [I.V.:1447.5; IV Piggyback:370.6] Out: 750 [Urine:750] Intake/Output this shift: No intake/output data recorded.  Physical Exam:  General: Alert and oriented CV: RRR Lungs: Clear Abdomen: Soft, ND, No CVAT   Lab Results: Recent Labs    09/16/18 1725 09/17/18 0435  HGB 14.9 13.0  HCT 43.2 39.0   CBC Latest Ref Rng & Units 09/17/2018 09/16/2018 08/28/2018  WBC 4.0 - 10.5 K/uL 9.3 10.3 4.3  Hemoglobin 13.0 - 17.0 g/dL 13.0 14.9 14.7  Hematocrit 39.0 - 52.0 % 39.0 43.2 42.9  Platelets 150 - 400 K/uL 119(L) 149(L) 237     BMET Recent Labs    09/16/18 1725 09/17/18 0435  NA 137 138  K 3.8 4.5  CL 103 107  CO2 21* 22  GLUCOSE 145* 190*  BUN 22 27*  CREATININE 1.89* 1.67*  CALCIUM 8.8* 8.4*     Studies/Results: Urine culture pending.  Assessment/Plan: 1) Right UPJ stone and UTI: S/p stent.  Will continue on broad spectrum IV antibiotics pending culture.  Check KUB today and will discuss options for eventual definitive stone treatment after resolution of infection.   LOS: 0 days   Dutch Gray 09/17/2018, 7:45 AM

## 2018-09-17 NOTE — Progress Notes (Signed)
Pharmacy Brief Note: Mercaptopurine  Mercaptopurine (Purinethol) Hold criteria  Hgb < 8  WBC < 3000  Pltc < 100K  AST or ALT > 3x ULN  Bili > 1.5x ULN  Active infection  Patient is currently on ceftriaxone for infection. Based on P&T criteria, have discontinued mercaptopurine at this time.   Lenis Noon, PharmD 09/17/18 10:46 AM

## 2018-09-18 LAB — CBC
HCT: 39.2 % (ref 39.0–52.0)
Hemoglobin: 13.1 g/dL (ref 13.0–17.0)
MCH: 31.9 pg (ref 26.0–34.0)
MCHC: 33.4 g/dL (ref 30.0–36.0)
MCV: 95.4 fL (ref 80.0–100.0)
Platelets: 148 10*3/uL — ABNORMAL LOW (ref 150–400)
RBC: 4.11 MIL/uL — ABNORMAL LOW (ref 4.22–5.81)
RDW: 15 % (ref 11.5–15.5)
WBC: 10.6 10*3/uL — ABNORMAL HIGH (ref 4.0–10.5)
nRBC: 0 % (ref 0.0–0.2)

## 2018-09-18 LAB — GLUCOSE, CAPILLARY
Glucose-Capillary: 121 mg/dL — ABNORMAL HIGH (ref 70–99)
Glucose-Capillary: 138 mg/dL — ABNORMAL HIGH (ref 70–99)

## 2018-09-18 MED ORDER — HYDROCODONE-ACETAMINOPHEN 5-325 MG PO TABS: 1.0000 | ORAL_TABLET | ORAL | 0 refills | Status: AC | PRN

## 2018-09-18 MED ORDER — HYDROCODONE-ACETAMINOPHEN 5-325 MG PO TABS: 1.0000 | ORAL_TABLET | ORAL | 0 refills | Status: DC | PRN

## 2018-09-18 NOTE — Discharge Instructions (Signed)
1. You may see some blood in the urine and may have some burning with urination for 48-72 hours. You also may notice that you have to urinate more frequently or urgently after your procedure which is normal.  2. You should call should you develop an inability urinate, fever > 101, persistent nausea and vomiting that prevents you from eating or drinking to stay hydrated.  3. If you have a stent, you will likely urinate more frequently and urgently until the stent is removed and you may experience some discomfort/pain in the lower abdomen and flank especially when urinating. You may take pain medication prescribed to you if needed for pain. You may also intermittently have blood in the urine until the stent is removed.   Follow-up with Dr. Alinda Money to further discuss stone treatment.

## 2018-09-18 NOTE — Progress Notes (Signed)
Patient ID: Brett Trevino, male   DOB: June 14, 1955, 63 y.o.   MRN: [id number]  2 Days Post-Op Subjective: He still has minimal pain s/p stent.  No fever in last 24 hours.  Objective: Vital signs in last 24 hours: Temp:  [98.4 F (36.9 C)-99.6 F (37.6 C)] 98.4 F (36.9 C) (06/17 0505) Pulse Rate:  [66-91] 67 (06/17 0505) Resp:  [20] 20 (06/17 0505) BP: (119-136)/(68-71) 119/68 (06/17 0505) SpO2:  [95 %-97 %] 97 % (06/17 0505)  Intake/Output from previous day: 06/16 0701 - 06/17 0700 In: 580 [P.O.:480; IV Piggyback:100] Out: 2050 [Urine:2050] Intake/Output this shift: No intake/output data recorded.  Physical Exam:  General: Alert and oriented Abdomen: Soft, ND, No CVAT Ext: NT, No erythema  Lab Results: Recent Labs    09/16/18 1725 09/17/18 0435 09/18/18 0444  HGB 14.9 13.0 13.1  HCT 43.2 39.0 39.2   Lab Results  Component Value Date   WBC 10.6 (H) 09/18/2018   HGB 13.1 09/18/2018   HCT 39.2 09/18/2018   MCV 95.4 09/18/2018   PLT 148 (L) 09/18/2018     BMET Recent Labs    09/16/18 1725 09/17/18 0435  NA 137 138  K 3.8 4.5  CL 103 107  CO2 21* 22  GLUCOSE 145* 190*  BUN 22 27*  CREATININE 1.89* 1.67*  CALCIUM 8.8* 8.4*     Studies/Results: Dg Abd 1 View  Result Date: 09/17/2018 CLINICAL DATA:  RIGHT-sided ureteral stone. EXAM: ABDOMEN - 1 VIEW COMPARISON:  None. FINDINGS: RIGHT-sided nephroureteral stent in place, with appropriate positioning. Suspected residual stone adjacent to the stent in the expected vicinity of the RIGHT UPJ. Visualized bowel gas pattern is nonobstructive. IMPRESSION: RIGHT nephroureteral stent in place. Suspected residual stone adjacent to the stent in the expected vicinity of the RIGHT UPJ. Electronically Signed   By: Franki Cabot M.D.   On: 09/17/2018 09:01   Dg C-arm 1-60 Min-no Report  Result Date: 09/16/2018 Fluoroscopy was utilized by the requesting physician.  No radiographic interpretation.    Assessment/Plan: 1)  R UPJ calculus: Stent in place.  Have discussed options and stone is visible on KUB.  Tentatively, pt wishes to proceed with ESWL after treatment of infection.  Will administer ceftriaxone this morning and discharge with plans to follow up on culture later today and will then continue oral antibiotic therapy.  Follow up next week as planned.  2) Prostate cancer: Pt informed that PSA is undetectable.   LOS: 1 day   Dutch Gray 09/18/2018, 7:15 AM

## 2018-09-18 NOTE — Discharge Summary (Signed)
Date of admission: 09/16/2018  Date of discharge: 09/18/2018  Admission diagnosis: Right ureteral stone  Discharge diagnosis: Right ureteral stone, UTI  Secondary diagnoses: Diabetes, prostate cancer  History and Physical: For full details, please see admission history and physical. Briefly, Brett Trevino is a 63 y.o. year old patient with a right UPJ calculus.   Hospital Course: He presented with severe pain and right UPJ calculus.  He underwent right ureteral stenting on 09/16/18 and purulent drainage was noted.  He was therefore admitted and put on IV antibiotics and cultures were obtained.  He was discharged after being afebrile for 48 hrs with plans to continue oral antibiotic therapy.  Laboratory values:  Recent Labs    09/16/18 1725 09/17/18 0435 09/18/18 0444  HGB 14.9 13.0 13.1  HCT 43.2 39.0 39.2   Recent Labs    09/16/18 1725 09/17/18 0435  CREATININE 1.89* 1.67*    Disposition: Home  Discharge instruction: The patient was instructed to be ambulatory but told to refrain from heavy lifting, strenuous activity, or driving.  Discharge medications:  Allergies as of 09/18/2018      Reactions   Morphine And Related Nausea Only      Medication List    TAKE these medications   atorvastatin 10 MG tablet Commonly known as: LIPITOR Take 1 tablet (10 mg total) by mouth daily.   HYDROcodone-acetaminophen 5-325 MG tablet Commonly known as: NORCO/VICODIN Take 1-2 tablets by mouth every 4 (four) hours as needed for up to 5 days for moderate pain.   loperamide 2 MG capsule Commonly known as: IMODIUM Take 1 capsule (2 mg total) by mouth daily as needed for diarrhea or loose stools.   mercaptopurine 50 MG tablet Commonly known as: PURINETHOL Take 1.5 tablets (75 mg total) by mouth daily. GIVE ON AN EMPTY STOMACH 1 HOUR BEFORE OR 2 HOURS AFTER MEALS.   metFORMIN 500 MG tablet Commonly known as: GLUCOPHAGE Take 500 mg by mouth daily.   MULTIVITAMIN ADULTS PO Take  by mouth daily.   VITAMIN D PO Take by mouth daily.       Followup:  Follow-up Information    Raynelle Bring, MD On 09/27/2018.   Specialty: Urology Contact information: [place] [address] [place] [address] [phone]

## 2018-09-19 LAB — URINE CULTURE: Culture: >=100000 — AB

## 2018-09-25 ENCOUNTER — Other Ambulatory Visit: Payer: Self-pay | Admitting: Urology

## 2018-09-27 DIAGNOSIS — N201 Calculus of ureter: Secondary | ICD-10-CM | POA: Diagnosis not present

## 2018-09-27 DIAGNOSIS — C61 Malignant neoplasm of prostate: Secondary | ICD-10-CM | POA: Diagnosis not present

## 2018-09-27 DIAGNOSIS — N393 Stress incontinence (female) (male): Secondary | ICD-10-CM | POA: Diagnosis not present

## 2018-09-30 ENCOUNTER — Encounter (HOSPITAL_COMMUNITY): Payer: Self-pay | Admitting: General Practice

## 2018-10-02 NOTE — H&P (Signed)
Office Visit Report     09/27/2018   --------------------------------------------------------------------------------   Brett Trevino  MRN: [id number]  DOB: 1955/04/13, 63 year old Male  SSN:    PRIMARY CARE:    REFERRING:  Judd Lien, MD  PROVIDER:  Raynelle Bring, M.D.  LOCATION:  [facility] [phone]     --------------------------------------------------------------------------------   CC/HPI: 1. High-risk, locally advanced prostate cancer  2. Right ureteral calculus   He follows up today approximately 10 months out from his radical prostatectomy for locally advanced, Gleason 9 prostate cancer with a positive margin at the left bladder neck. His PSA has remained undetectable. He was hospitalized last week for ureteral stone and his PSA was checked and remains undetectable. Considering his history of inflammatory bowel disease, he has wish to try to avoid radiation therapy if possible. He presented over week ago with right-sided pain and was diagnosed with an 8 mm proximal right ureteral stone. He subsequently developed fever at the time of stent placement and had purulence drainage from his right ureter. He was admitted and placed on appropriate antibiotic therapy. He has now completed his antibiotic therapy and follows up today for a repeat urinalysis and to plan definitive management of the stone.     ALLERGIES: None   MEDICATIONS: Aspirin  Metformin Hcl  Atorvastatin Calcium  Magnesium  Multivitamin  Naproxen 500 mg tablet  Ondansetron Odt 4 mg tablet,disintegrating  Vitamin B12     GU PSH: Cystoscopy Insert Stent, Right - 09/16/2018 Laparoscopy; Lymphadenectomy - 12/06/2017 Robotic Radical Prostatectomy - 12/06/2017     NON-GU PSH: None   GU PMH: Acute Cystitis/UTI - 09/16/2018 Ureteral calculus, 8 mm right UPJ - 09/16/2018 Stress Incontinence - 12/12/2017 Prostate Cancer, T1c N0 M0 Gleason 8 high risk prostate cancer with severe LUTs and a  80ml prostate. I have discussed surgery vs radiation therapy with 2 years of ADT. With his LUTS, I believe he will be best served with primary surgical therapy, but if he is interested in XRT, I would want to reassess voiding after 2-3 months of ADT prior to beginning ADT. After reviewing the options he would like to be referred for Prostatectomy. - 10/26/2017 ED due to arterial insufficiency, He has progressive severe ED. We can address that in the future if he desires. - 08/31/2017      PMH Notes:   1) Prostate cancer: He is s/p a UNS RAL radical prostatectomy and BPLND on 12/06/17.   Diagnosis:pT3a N0 Mx, Gleason 5+4=9 adenocarcinoma with positive margin (4 mm, Gleason 9 at left bladder neck in area of EPE)  Pretreatment PSA: 17.8  Pretreatment SHIM score: 6   2) Urolithiasis: He presented in June 2020 with his first stone episode.   Jun 2020: R stent placement     NON-GU PMH: Muscle weakness (generalized) - 11/09/2017 Crohns Disease Diabetes Type 2 GERD Hypercholesterolemia    FAMILY HISTORY: Colon Cancer - Runs in Family Diabetes - Runs in Family heart - Runs in Family   SOCIAL HISTORY: Marital Status: Married Preferred Language: English Current Smoking Status: Patient has never smoked.   Tobacco Use Assessment Completed: Used Tobacco in last 30 days? Has never drank.  Does not drink caffeine. Patient's occupation Engineer, maintenance.    REVIEW OF SYSTEMS:    GU Review Male:   Patient denies frequent urination, hard to postpone urination, burning/ pain with urination, get up at night to urinate, leakage of urine, stream starts and stops, trouble starting your streams, and  have to strain to urinate .  Gastrointestinal (Upper):   Patient denies nausea and vomiting.  Gastrointestinal (Lower):   Patient denies diarrhea and constipation.  Constitutional:   Patient denies fatigue, fever, weight loss, and night sweats.  Skin:   Patient denies skin rash/ lesion and itching.  Eyes:    Patient denies blurred vision and double vision.  Ears/ Nose/ Throat:   Patient denies sore throat and sinus problems.  Hematologic/Lymphatic:   Patient denies swollen glands and easy bruising.  Cardiovascular:   Patient denies leg swelling and chest pains.  Respiratory:   Patient denies cough and shortness of breath.  Endocrine:   Patient denies excessive thirst.  Musculoskeletal:   Patient denies back pain and joint pain.  Neurological:   Patient denies headaches and dizziness.  Psychologic:   Patient denies depression and anxiety.   VITAL SIGNS:      09/27/2018 02:33 PM  Weight 227 lb / 102.97 kg  Height 67 in / 170.18 cm  BP 119/70 mmHg  Pulse 66 /min  BMI 35.5 kg/m   MULTI-SYSTEM PHYSICAL EXAMINATION:    Constitutional: Well-nourished. No physical deformities. Normally developed. Good grooming.  Neck: Neck symmetrical, not swollen. Normal tracheal position.  Respiratory: No labored breathing, no use of accessory muscles. Clear bilaterally.  Cardiovascular: Normal temperature, normal extremity pulses, no swelling, no varicosities. Regular rate and rhythm.  Lymphatic: No enlargement of neck, axillae, groin.  Skin: No paleness, no jaundice, no cyanosis. No lesion, no ulcer, no rash.  Neurologic / Psychiatric: Oriented to time, oriented to place, oriented to person. No depression, no anxiety, no agitation.  Gastrointestinal: No mass, no tenderness, no rigidity, non obese abdomen.  Eyes: Normal conjunctivae. Normal eyelids.  Ears, Nose, Mouth, and Throat: Left ear no scars, no lesions, no masses. Right ear no scars, no lesions, no masses. Nose no scars, no lesions, no masses. Normal hearing. Normal lips.  Musculoskeletal: Normal gait and station of head and neck.     PAST DATA REVIEWED:  Source Of History:  Patient  Lab Test Review:   PSA  Urine Test Review:   Urinalysis  X-Ray Review: KUB: Reviewed Films.     06/10/18 02/26/18 08/31/17 07/05/17  PSA  Total PSA <0.015 ng/mL  <0.015 ng/mL 17.8 ng/dl 13 ng/dl   Notes:                     I reviewed his KUB from his recent hospitalization. This demonstrates a clearly radiopaque stone in the proximal end of his right ureteral stent.   PROCEDURES:          Urinalysis Dipstick Dipstick Cont'd  Color: Yellow Bilirubin: Neg mg/dL  Appearance: Clear Ketones: Neg mg/dL  Specific Gravity: 1.025 Blood: Neg ery/uL  pH: 5.5 Protein: Neg mg/dL  Glucose: Neg mg/dL Urobilinogen: 0.2 mg/dL    Nitrites: Neg    Leukocyte Esterase: Neg leu/uL    ASSESSMENT:      ICD-10 Details  1 GU:   Prostate Cancer - C61   2   Stress Incontinence - N39.3   3   Ureteral calculus - N20.1    PLAN:           Schedule Labs: 3 Months - PSA    6 Months - PSA    6 Months - Urinalysis  Return Visit/Planned Activity: 6 Months - Office Visit  Return Visit/Planned Activity: Keep Scheduled Appointment          Document Letter(s):  Created for  Patient: Clinical Summary         Notes:   1. High-risk, locally advanced prostate cancer: His PSA fortunately remains undetectable. We again reviewed the options of adjuvant radiation therapy versus continued PSA surveillance. He has regained continence for the most part but wishes to continue with observation considering his inflammatory bowel disease. He understands that he likely could receive radiation therapy if necessary if he develops a PSA recurrence.   2. Incontinence: Continue pelvic floor muscle exercises/physical therapy. This has significantly improved and he is now only wearing 1 pad for safety protective purposes.   3. Erectile dysfunction: He did have severe preoperative dysfunction.   4. Right ureteral calculus: After reviewing options for treatment, he does elect to proceed with shockwave lithotripsy. This is tentatively scheduled for next week. We have reviewed potential risks, complications, and expected recovery process.   Cc: Dr. Judd Lien         Next Appointment:       Next Appointment: 10/03/2018 10:00 AM    Appointment Type: Surgery     Location: [facility] [phone]    Provider: Raynelle Bring, M.D.    Reason for Visit: WL/OP RT ESWL      * Signed by Raynelle Bring, M.D. on 09/27/18 at 5:07 PM (EDT)*

## 2018-10-03 ENCOUNTER — Ambulatory Visit (HOSPITAL_COMMUNITY): Payer: BC Managed Care – PPO

## 2018-10-03 ENCOUNTER — Encounter (HOSPITAL_COMMUNITY)
Admission: RE | Disposition: A | Discharge status: Home / Self Care | Payer: Self-pay | Source: Other Acute Inpatient Hospital | Attending: Urology

## 2018-10-03 ENCOUNTER — Encounter (HOSPITAL_COMMUNITY): Payer: Self-pay

## 2018-10-03 ENCOUNTER — Other Ambulatory Visit: Payer: Self-pay

## 2018-10-03 ENCOUNTER — Ambulatory Visit (HOSPITAL_COMMUNITY)
Admission: RE | Admit: 2018-10-03 | Discharge: 2018-10-03 | Disposition: A | Discharge status: Home / Self Care | Payer: BC Managed Care – PPO | Source: Other Acute Inpatient Hospital | Attending: Urology | Admitting: Urology

## 2018-10-03 DIAGNOSIS — E7800 Pure hypercholesterolemia, unspecified: Secondary | ICD-10-CM | POA: Insufficient documentation

## 2018-10-03 DIAGNOSIS — N201 Calculus of ureter: Secondary | ICD-10-CM | POA: Diagnosis not present

## 2018-10-03 DIAGNOSIS — N200 Calculus of kidney: Secondary | ICD-10-CM

## 2018-10-03 DIAGNOSIS — Z7984 Long term (current) use of oral hypoglycemic drugs: Secondary | ICD-10-CM | POA: Insufficient documentation

## 2018-10-03 DIAGNOSIS — Z7982 Long term (current) use of aspirin: Secondary | ICD-10-CM | POA: Insufficient documentation

## 2018-10-03 DIAGNOSIS — N393 Stress incontinence (female) (male): Secondary | ICD-10-CM | POA: Diagnosis not present

## 2018-10-03 DIAGNOSIS — Z79899 Other long term (current) drug therapy: Secondary | ICD-10-CM | POA: Insufficient documentation

## 2018-10-03 DIAGNOSIS — E119 Type 2 diabetes mellitus without complications: Secondary | ICD-10-CM | POA: Insufficient documentation

## 2018-10-03 DIAGNOSIS — C61 Malignant neoplasm of prostate: Secondary | ICD-10-CM | POA: Insufficient documentation

## 2018-10-03 HISTORY — DX: Personal history of urinary calculi: Z87.442

## 2018-10-03 HISTORY — PX: EXTRACORPOREAL SHOCK WAVE LITHOTRIPSY: SHX1557

## 2018-10-03 LAB — GLUCOSE, CAPILLARY: Glucose-Capillary: 101 mg/dL — ABNORMAL HIGH (ref 70–99)

## 2018-10-03 SURGERY — LITHOTRIPSY, ESWL
Anesthesia: LOCAL | Laterality: Right

## 2018-10-03 MED ORDER — CIPROFLOXACIN HCL 500 MG PO TABS
500.0000 mg | ORAL_TABLET | ORAL | Status: AC
  Administered 2018-10-03: 500 mg via ORAL
  Filled 2018-10-03: qty 1

## 2018-10-03 MED ORDER — SODIUM CHLORIDE 0.9 % IV SOLN: INTRAVENOUS | Status: DC

## 2018-10-03 MED ORDER — DIAZEPAM 5 MG PO TABS
10.0000 mg | ORAL_TABLET | ORAL | Status: AC
  Administered 2018-10-03: 10 mg via ORAL
  Filled 2018-10-03: qty 2

## 2018-10-03 MED ORDER — DIPHENHYDRAMINE HCL 25 MG PO CAPS
25.0000 mg | ORAL_CAPSULE | ORAL | Status: AC
  Administered 2018-10-03: 25 mg via ORAL
  Filled 2018-10-03: qty 1

## 2018-10-03 NOTE — Op Note (Signed)
See [facility] operative note scanned into chart. Also because of the size, density, location and other factors that cannot be anticipated I feel this will likely be a staged procedure. This fact supersedes any indication in the scanned [facility] operative note to the contrary.  

## 2018-10-03 NOTE — Interval H&P Note (Signed)
History and Physical Interval Note:  10/03/2018 8:50 AM  Brett Trevino  has presented today for surgery, with the diagnosis of RIGHT RENAL CALCULUS.  The various methods of treatment have been discussed with the patient and family. After consideration of risks, benefits and other options for treatment, the patient has consented to  Procedure(s): EXTRACORPOREAL SHOCK WAVE LITHOTRIPSY (ESWL) (Right) as a surgical intervention.  The patient's history has been reviewed, patient examined, no change in status, stable for surgery.  I have reviewed the patient's chart and labs.  Questions were answered to the patient's satisfaction.     [employer]

## 2018-10-03 NOTE — Discharge Instructions (Signed)
1. You should strain your urine and collect all fragments and bring them to your follow up appointment.  2. You should take your pain medication as needed.  Please call if your pain is severe to the point that it is not controlled with your pain medication. 3. You should call if you develop fever > 101 or persistent nausea or vomiting.   

## 2018-10-07 ENCOUNTER — Encounter (HOSPITAL_COMMUNITY): Payer: Self-pay | Admitting: Urology

## 2018-10-11 DIAGNOSIS — N201 Calculus of ureter: Secondary | ICD-10-CM | POA: Diagnosis not present

## 2018-11-25 ENCOUNTER — Other Ambulatory Visit (INDEPENDENT_AMBULATORY_CARE_PROVIDER_SITE_OTHER): Payer: Self-pay | Admitting: *Deleted

## 2018-11-25 DIAGNOSIS — K50018 Crohn's disease of small intestine with other complication: Secondary | ICD-10-CM

## 2018-12-04 LAB — CBC WITH DIFFERENTIAL/PLATELET
Absolute Monocytes: 420 cells/uL (ref 200–950)
Basophils Absolute: 38 cells/uL (ref 0–200)
Basophils Relative: 0.9 %
Eosinophils Absolute: 168 cells/uL (ref 15–500)
Eosinophils Relative: 4 %
HCT: 40.9 % (ref 38.5–50.0)
Hemoglobin: 13.9 g/dL (ref 13.2–17.1)
Lymphs Abs: 735 cells/uL — ABNORMAL LOW (ref 850–3900)
MCH: 32.1 pg (ref 27.0–33.0)
MCHC: 34 g/dL (ref 32.0–36.0)
MCV: 94.5 fL (ref 80.0–100.0)
MPV: 10.3 fL (ref 7.5–12.5)
Monocytes Relative: 10 %
Neutro Abs: 2839 cells/uL (ref 1500–7800)
Neutrophils Relative %: 67.6 %
Platelets: 221 10*3/uL (ref 140–400)
RBC: 4.33 10*6/uL (ref 4.20–5.80)
RDW: 14.5 % (ref 11.0–15.0)
Total Lymphocyte: 17.5 %
WBC: 4.2 10*3/uL (ref 3.8–10.8)

## 2018-12-04 LAB — COMPREHENSIVE METABOLIC PANEL
AG Ratio: 1.3 (calc) (ref 1.0–2.5)
ALT: 15 U/L (ref 9–46)
AST: 19 U/L (ref 10–35)
Albumin: 3.8 g/dL (ref 3.6–5.1)
Alkaline phosphatase (APISO): 46 U/L (ref 35–144)
BUN: 9 mg/dL (ref 7–25)
CO2: 29 mmol/L (ref 20–32)
Calcium: 9.1 mg/dL (ref 8.6–10.3)
Chloride: 107 mmol/L (ref 98–110)
Creat: 0.95 mg/dL (ref 0.70–1.25)
Globulin: 2.9 g/dL (calc) (ref 1.9–3.7)
Glucose, Bld: 117 mg/dL (ref 65–139)
Potassium: 4 mmol/L (ref 3.5–5.3)
Sodium: 143 mmol/L (ref 135–146)
Total Bilirubin: 0.9 mg/dL (ref 0.2–1.2)
Total Protein: 6.7 g/dL (ref 6.1–8.1)

## 2018-12-05 ENCOUNTER — Other Ambulatory Visit (INDEPENDENT_AMBULATORY_CARE_PROVIDER_SITE_OTHER): Payer: Self-pay | Admitting: *Deleted

## 2018-12-05 DIAGNOSIS — K50019 Crohn's disease of small intestine with unspecified complications: Secondary | ICD-10-CM

## 2018-12-05 NOTE — Progress Notes (Signed)
crp

## 2019-01-29 ENCOUNTER — Other Ambulatory Visit (INDEPENDENT_AMBULATORY_CARE_PROVIDER_SITE_OTHER): Payer: Self-pay | Admitting: *Deleted

## 2019-01-29 DIAGNOSIS — K50019 Crohn's disease of small intestine with unspecified complications: Secondary | ICD-10-CM

## 2019-02-27 LAB — HEPATIC FUNCTION PANEL
AG Ratio: 1.3 (calc) (ref 1.0–2.5)
ALT: 24 U/L (ref 9–46)
AST: 26 U/L (ref 10–35)
Albumin: 4 g/dL (ref 3.6–5.1)
Alkaline phosphatase (APISO): 57 U/L (ref 35–144)
Bilirubin, Direct: 0.2 mg/dL (ref 0.0–0.2)
Globulin: 3.1 g/dL (calc) (ref 1.9–3.7)
Indirect Bilirubin: 0.6 mg/dL (calc) (ref 0.2–1.2)
Total Bilirubin: 0.8 mg/dL (ref 0.2–1.2)
Total Protein: 7.1 g/dL (ref 6.1–8.1)

## 2019-02-27 LAB — CBC WITH DIFFERENTIAL/PLATELET
Absolute Monocytes: 474 cells/uL (ref 200–950)
Basophils Absolute: 69 cells/uL (ref 0–200)
Basophils Relative: 1.5 %
Eosinophils Absolute: 69 cells/uL (ref 15–500)
Eosinophils Relative: 1.5 %
HCT: 43 % (ref 38.5–50.0)
Hemoglobin: 14.9 g/dL (ref 13.2–17.1)
Lymphs Abs: 989 cells/uL (ref 850–3900)
MCH: 32.1 pg (ref 27.0–33.0)
MCHC: 34.7 g/dL (ref 32.0–36.0)
MCV: 92.7 fL (ref 80.0–100.0)
MPV: 10.5 fL (ref 7.5–12.5)
Monocytes Relative: 10.3 %
Neutro Abs: 2999 cells/uL (ref 1500–7800)
Neutrophils Relative %: 65.2 %
Platelets: 251 10*3/uL (ref 140–400)
RBC: 4.64 10*6/uL (ref 4.20–5.80)
RDW: 14 % (ref 11.0–15.0)
Total Lymphocyte: 21.5 %
WBC: 4.6 10*3/uL (ref 3.8–10.8)

## 2019-02-27 LAB — C-REACTIVE PROTEIN: CRP: 1.3 mg/L (ref <8.0)

## 2019-03-03 ENCOUNTER — Encounter (INDEPENDENT_AMBULATORY_CARE_PROVIDER_SITE_OTHER): Payer: Self-pay

## 2019-03-04 ENCOUNTER — Other Ambulatory Visit: Payer: Self-pay

## 2019-03-04 ENCOUNTER — Encounter (INDEPENDENT_AMBULATORY_CARE_PROVIDER_SITE_OTHER): Payer: Self-pay | Admitting: Internal Medicine

## 2019-03-04 ENCOUNTER — Ambulatory Visit (INDEPENDENT_AMBULATORY_CARE_PROVIDER_SITE_OTHER): Payer: Medicare Other | Admitting: Internal Medicine

## 2019-03-04 VITALS — Ht 67.0 in

## 2019-03-04 DIAGNOSIS — K5000 Crohn's disease of small intestine without complications: Secondary | ICD-10-CM

## 2019-03-04 NOTE — Patient Instructions (Signed)
Patient advised to call office if he has abdominal pain or or increase in stool frequency.

## 2019-03-04 NOTE — Progress Notes (Signed)
Virtual Visit via Telephone Note  Patient had face-to-face office visit today.  Because of ongoing Covid-19 pandemic he was changed to virtual visit and he was agreeable. I connected with Doreen Beam on 03/04/19 at  10:15 AM EST by telephone and verified that I am speaking with the correct person using two identifiers.  Location: Patient: office Provider: office   I discussed the limitations, risks, security and privacy concerns of performing an evaluation and management service by telephone and the availability of in person appointments. I also discussed with the patient that there may be a patient responsible charge related to this service. The patient expressed understanding and agreed to proceed.  History of Present Illness:  Patient has history of small bowel Crohn's dise ase.  He presented with bowel obstruction last year.  Colonoscopy in September 2019 revealed left-sided diverticulosis and active disease in terminal ileum.  He was last seen in the office on 09/03/2018 and was doing well.  He has been on 6-MP for 1 year. Patient states he is doing well.  He has been off Metformin for 1 month.  He feels stool frequency has improved.  On most days he has 1-2 stools per day.  Stool consistency is mostly mushy.  Occasionally he may have more like a normal stool.  He denies abdominal pain melena or rectal bleeding.  He says his appetite is good.  He has not lost any weight.  He is not having any side effects with 6-MP.  He had blood work as planned which is reviewed below. Patient states he was hospitalized in June 2020 for calculus at right UPJ.  He was also thought to have UTI.  He underwent ureteral stenting followed by lithotripsy and stent was subsequently removed.  He is presently not having any flank pain or hematuria.   Current Outpatient Medications:  .  atorvastatin (LIPITOR) 10 MG tablet, Take 1 tablet (10 mg total) by mouth daily., Disp: , Rfl:  .  Calcium Citrate (CITRACAL PO),  Take by mouth daily., Disp: , Rfl:  .  loperamide (IMODIUM) 2 MG capsule, Take 1 capsule (2 mg total) by mouth daily as needed for diarrhea or loose stools., Disp: , Rfl:  .  mercaptopurine (PURINETHOL) 50 MG tablet, Take 1.5 tablets (75 mg total) by mouth daily. GIVE ON AN EMPTY STOMACH 1 HOUR BEFORE OR 2 HOURS AFTER MEALS., Disp: 60 tablet, Rfl: 5 .  Multiple Vitamin (MULTI VITAMIN MENS PO), Take by mouth daily., Disp: , Rfl:  .  VITAMIN D PO, Take by mouth daily., Disp: , Rfl:     Observations/Objective: Patient reported his weight to be 230 pounds.   Labs/studies Results:  CBC Latest Ref Rng & Units 02/26/2019 12/04/2018 09/18/2018  WBC 3.8 - 10.8 Thousand/uL 4.6 4.2 10.6(H)  Hemoglobin 13.2 - 17.1 g/dL 14.9 13.9 13.1  Hematocrit 38.5 - 50.0 % 43.0 40.9 39.2  Platelets 140 - 400 Thousand/uL 251 221 148(L)    CMP Latest Ref Rng & Units 02/26/2019 12/04/2018 09/17/2018  Glucose 65 - 139 mg/dL - 117 190(H)  BUN 7 - 25 mg/dL - 9 27(H)  Creatinine 0.70 - 1.25 mg/dL - 0.95 1.67(H)  Sodium 135 - 146 mmol/L - 143 138  Potassium 3.5 - 5.3 mmol/L - 4.0 4.5  Chloride 98 - 110 mmol/L - 107 107  CO2 20 - 32 mmol/L - 29 22  Calcium 8.6 - 10.3 mg/dL - 9.1 8.4(L)  Total Protein 6.1 - 8.1 g/dL 7.1 6.7 -  Total  Bilirubin 0.2 - 1.2 mg/dL 0.8 0.9 -  Alkaline Phos 38 - 126 U/L - - -  AST 10 - 35 U/L 26 19 -  ALT 9 - 46 U/L 24 15 -    Hepatic Function Latest Ref Rng & Units 02/26/2019 12/04/2018 08/28/2018  Total Protein 6.1 - 8.1 g/dL 7.1 6.7 6.7  Albumin 3.5 - 5.0 g/dL - - -  AST 10 - 35 U/L '26 19 23  ' ALT 9 - 46 U/L '24 15 23  ' Alk Phosphatase 38 - 126 U/L - - -  Total Bilirubin 0.2 - 1.2 mg/dL 0.8 0.9 0.7  Bilirubin, Direct 0.0 - 0.2 mg/dL 0.2 - -    Lab Results  Component Value Date   CRP 1.3 02/26/2019     Assessment and Plan:  #1.  Small bowel Crohn's disease.  He presented with small bowel obstruction and responded to medical therapy.  He has had diarrhea for years.  Clinically he appears  to be in remission.  He is not experience any side effects with current 6-MP dose.  #2.  History of mildly elevated transaminases possibly related to 6-MP at a higher dose.  He remains on atorvastatin at a low dose and transaminases remain normal.  Follow Up Instructions:  Patient will continue 6-MP at current dose which is 75 mg daily. He will have CBC with differential and c-Met in 3 months. Office visit in 6 months.  I discussed the assessment and treatment plan with the patient. The patient was provided an opportunity to ask questions and all were answered. The patient agreed with the plan and demonstrated an understanding of the instructions.   The patient was advised to call back or seek an in-person evaluation if the symptoms worsen or if the condition fails to improve as anticipated.  I provided 13 minutes of non-face-to-face time during this encounter.   Hildred Laser, MD

## 2019-03-24 ENCOUNTER — Other Ambulatory Visit (INDEPENDENT_AMBULATORY_CARE_PROVIDER_SITE_OTHER): Payer: Self-pay | Admitting: *Deleted

## 2019-03-24 ENCOUNTER — Encounter (INDEPENDENT_AMBULATORY_CARE_PROVIDER_SITE_OTHER): Payer: Self-pay

## 2019-03-24 DIAGNOSIS — R197 Diarrhea, unspecified: Secondary | ICD-10-CM

## 2019-03-26 LAB — GASTROINTESTINAL PATHOGEN PANEL PCR
C. difficile Tox A/B, PCR: NOT DETECTED
Campylobacter, PCR: NOT DETECTED
Cryptosporidium, PCR: NOT DETECTED
E coli (ETEC) LT/ST PCR: NOT DETECTED
E coli (STEC) stx1/stx2, PCR: NOT DETECTED
E coli 0157, PCR: NOT DETECTED
Giardia lamblia, PCR: NOT DETECTED
Norovirus, PCR: NOT DETECTED
Rotavirus A, PCR: NOT DETECTED
Salmonella, PCR: NOT DETECTED
Shigella, PCR: NOT DETECTED

## 2019-03-27 ENCOUNTER — Other Ambulatory Visit (INDEPENDENT_AMBULATORY_CARE_PROVIDER_SITE_OTHER): Payer: Self-pay | Admitting: Internal Medicine

## 2019-03-27 MED ORDER — PREDNISONE 10 MG PO TABS: 30.0000 mg | ORAL_TABLET | Freq: Every day | ORAL | 0 refills | Status: DC

## 2019-05-01 ENCOUNTER — Other Ambulatory Visit (INDEPENDENT_AMBULATORY_CARE_PROVIDER_SITE_OTHER): Payer: Self-pay | Admitting: Internal Medicine

## 2019-05-01 ENCOUNTER — Ambulatory Visit: Payer: Medicare Other | Attending: Internal Medicine

## 2019-05-01 ENCOUNTER — Other Ambulatory Visit: Payer: Self-pay

## 2019-05-01 DIAGNOSIS — Z20822 Contact with and (suspected) exposure to covid-19: Secondary | ICD-10-CM

## 2019-05-02 ENCOUNTER — Telehealth (INDEPENDENT_AMBULATORY_CARE_PROVIDER_SITE_OTHER): Payer: Self-pay | Admitting: Gastroenterology

## 2019-05-02 ENCOUNTER — Other Ambulatory Visit (INDEPENDENT_AMBULATORY_CARE_PROVIDER_SITE_OTHER): Payer: Self-pay | Admitting: *Deleted

## 2019-05-02 DIAGNOSIS — K5000 Crohn's disease of small intestine without complications: Secondary | ICD-10-CM

## 2019-05-02 LAB — NOVEL CORONAVIRUS, NAA: SARS-CoV-2, NAA: NOT DETECTED

## 2019-05-02 MED ORDER — MERCAPTOPURINE 50 MG PO TABS: 75.0000 mg | ORAL_TABLET | Freq: Every day | ORAL | 5 refills | Status: DC

## 2019-05-02 NOTE — Telephone Encounter (Signed)
Brett Trevino - received refill request for this patient for prednisone. Dr Laural Golden started taper dose in Dec. I do not think plan is for long term prednisone so should not need refills if he is doing well symptomatically. Does likely need OV or telephone visit to discuss how he is doing after taper. Thanks.

## 2019-05-02 NOTE — Telephone Encounter (Signed)
I received refill request for prednisone. Dr Laural Golden start in Dec 2020 but I do not think he wants to continue after patient finishes taper. Patient does likely need OV or telephone visit to see how he is doing after completing steriod taper dose.   Tammy - can you call patient to f/up on this? Thanks.Marland Kitchen

## 2019-05-02 NOTE — Telephone Encounter (Signed)
Patient was called. He states that he made error entering his number into the pharmacy. He does not need the Prednisone. He needed his 6 MP and this was done.

## 2019-05-28 ENCOUNTER — Other Ambulatory Visit (INDEPENDENT_AMBULATORY_CARE_PROVIDER_SITE_OTHER): Payer: Self-pay | Admitting: *Deleted

## 2019-05-28 DIAGNOSIS — K5000 Crohn's disease of small intestine without complications: Secondary | ICD-10-CM

## 2019-06-02 LAB — CBC WITH DIFFERENTIAL/PLATELET
Absolute Monocytes: 546 cells/uL (ref 200–950)
Basophils Absolute: 51 cells/uL (ref 0–200)
Basophils Relative: 1 %
Eosinophils Absolute: 281 cells/uL (ref 15–500)
Eosinophils Relative: 5.5 %
HCT: 42 % (ref 38.5–50.0)
Hemoglobin: 14.3 g/dL (ref 13.2–17.1)
Lymphs Abs: 847 cells/uL — ABNORMAL LOW (ref 850–3900)
MCH: 32 pg (ref 27.0–33.0)
MCHC: 34 g/dL (ref 32.0–36.0)
MCV: 94 fL (ref 80.0–100.0)
MPV: 10.1 fL (ref 7.5–12.5)
Monocytes Relative: 10.7 %
Neutro Abs: 3376 cells/uL (ref 1500–7800)
Neutrophils Relative %: 66.2 %
Platelets: 234 10*3/uL (ref 140–400)
RBC: 4.47 10*6/uL (ref 4.20–5.80)
RDW: 14.1 % (ref 11.0–15.0)
Total Lymphocyte: 16.6 %
WBC: 5.1 10*3/uL (ref 3.8–10.8)

## 2019-06-02 LAB — COMPREHENSIVE METABOLIC PANEL
AG Ratio: 1.5 (calc) (ref 1.0–2.5)
ALT: 24 U/L (ref 9–46)
AST: 24 U/L (ref 10–35)
Albumin: 3.9 g/dL (ref 3.6–5.1)
Alkaline phosphatase (APISO): 51 U/L (ref 35–144)
BUN: 14 mg/dL (ref 7–25)
CO2: 29 mmol/L (ref 20–32)
Calcium: 9 mg/dL (ref 8.6–10.3)
Chloride: 107 mmol/L (ref 98–110)
Creat: 1.08 mg/dL (ref 0.70–1.25)
Globulin: 2.6 g/dL (calc) (ref 1.9–3.7)
Glucose, Bld: 126 mg/dL — ABNORMAL HIGH (ref 65–99)
Potassium: 4.7 mmol/L (ref 3.5–5.3)
Sodium: 141 mmol/L (ref 135–146)
Total Bilirubin: 0.5 mg/dL (ref 0.2–1.2)
Total Protein: 6.5 g/dL (ref 6.1–8.1)

## 2019-06-03 ENCOUNTER — Other Ambulatory Visit (INDEPENDENT_AMBULATORY_CARE_PROVIDER_SITE_OTHER): Payer: Self-pay | Admitting: *Deleted

## 2019-06-03 DIAGNOSIS — K5000 Crohn's disease of small intestine without complications: Secondary | ICD-10-CM

## 2019-06-03 DIAGNOSIS — R112 Nausea with vomiting, unspecified: Secondary | ICD-10-CM

## 2019-06-11 ENCOUNTER — Ambulatory Visit (HOSPITAL_COMMUNITY): Payer: Medicare Other

## 2019-06-11 ENCOUNTER — Other Ambulatory Visit (HOSPITAL_COMMUNITY): Payer: Medicare Other

## 2019-06-12 ENCOUNTER — Encounter (HOSPITAL_COMMUNITY): Payer: Self-pay

## 2019-06-13 ENCOUNTER — Other Ambulatory Visit: Payer: Self-pay

## 2019-06-13 ENCOUNTER — Ambulatory Visit (HOSPITAL_COMMUNITY)
Admission: RE | Admit: 2019-06-13 | Discharge: 2019-06-13 | Disposition: A | Discharge status: Home / Self Care | Payer: Medicare Other | Source: Ambulatory Visit | Attending: Internal Medicine | Admitting: Internal Medicine

## 2019-06-13 ENCOUNTER — Inpatient Hospital Stay (HOSPITAL_COMMUNITY): Admission: RE | Admit: 2019-06-13 | Payer: Medicare Other | Source: Ambulatory Visit

## 2019-06-13 DIAGNOSIS — R112 Nausea with vomiting, unspecified: Secondary | ICD-10-CM

## 2019-06-13 DIAGNOSIS — K5000 Crohn's disease of small intestine without complications: Secondary | ICD-10-CM | POA: Insufficient documentation

## 2019-06-13 MED ORDER — GADOBUTROL 1 MMOL/ML IV SOLN
10.0000 mL | Freq: Once | INTRAVENOUS | Status: AC | PRN
  Administered 2019-06-13: 10 mL via INTRAVENOUS

## 2019-08-14 ENCOUNTER — Encounter: Payer: Self-pay | Admitting: *Deleted

## 2019-08-15 ENCOUNTER — Ambulatory Visit (INDEPENDENT_AMBULATORY_CARE_PROVIDER_SITE_OTHER): Payer: Medicare Other | Admitting: Neurology

## 2019-08-15 ENCOUNTER — Other Ambulatory Visit: Payer: Self-pay

## 2019-08-15 ENCOUNTER — Encounter: Payer: Self-pay | Admitting: Neurology

## 2019-08-15 VITALS — BP 129/84 | HR 67 | Ht 67.0 in | Wt 238.0 lb

## 2019-08-15 DIAGNOSIS — R202 Paresthesia of skin: Secondary | ICD-10-CM | POA: Diagnosis not present

## 2019-08-15 DIAGNOSIS — M2656 Non-working side interference: Secondary | ICD-10-CM

## 2019-08-15 DIAGNOSIS — G253 Myoclonus: Secondary | ICD-10-CM | POA: Diagnosis not present

## 2019-08-15 NOTE — Progress Notes (Signed)
PATIENT: Brett Trevino DOB: 1955/07/19  Chief Complaint  Patient presents with  . New Patient (Initial Visit)    RM 4, alone. PCP/Referring: Dr. Pleas Koch. Vision: 20/30 with correction bilaterally. Having shaking spells. More prevalent in the evening at rest. Also complaining of R hand numbness that started 15-20 years ago. Was told it was carpal tunnel but symptoms resolved.     HISTORICAL  Brett Trevino is a 64 year old male, seen in request by his primary care physician Dr. Judd Lien for evaluation of shaking spells, initial evaluation was on Aug 15, 2019.  I have reviewed and summarized the referring note from the referring physician. he had a past medical history of hyperlipidemia, Crohn's disease, taking mercaptopurine 75 mg daily, hyperlipidemia, his GI symptoms are under good control.  He reported intermittent episode of body jerking movement since 2018, initially only intermittently, increased frequency, it happens on a daily basis, transient whole body jerking lasting for few seconds, no loss of consciousness, no confusion, seems to be mild frequent at the end of the day, after concentration, while he is tired  He was seen by neurologist in the past for evaluation of right hand paresthesia around 2015, this was done at [place], reported diagnosis of right hand carpal tunnel syndromes, recent few months, he also noticed recurrent intermittent right hand paresthesia  MRI of abdomen, pelvic, with without contrast showed no findings suspicious for acute inflammatory Crohn's disease, no evidence of bowel obstruction no stricture.  Laboratory evaluation in March 2021, normal CBC, C-reactive protein, CMP, REVIEW OF SYSTEMS: Full 14 system review of systems performed and notable only for as above All other review of systems were negative.  ALLERGIES: Allergies  Allergen Reactions  . Morphine And Related Nausea Only    HOME MEDICATIONS: Current Outpatient Medications   Medication Sig Dispense Refill  . aspirin EC 81 MG tablet Take 81 mg by mouth daily.    Marland Kitchen atorvastatin (LIPITOR) 10 MG tablet Take 1 tablet (10 mg total) by mouth daily.    . Calcium Citrate (CITRACAL PO) Take by mouth daily.    Marland Kitchen loperamide (IMODIUM) 2 MG capsule Take 1 capsule (2 mg total) by mouth daily as needed for diarrhea or loose stools.    . mercaptopurine (PURINETHOL) 50 MG tablet Take 1.5 tablets (75 mg total) by mouth daily. GIVE ON AN EMPTY STOMACH 1 HOUR BEFORE OR 2 HOURS AFTER MEALS. 60 tablet 5  . Multiple Vitamin (MULTI VITAMIN MENS PO) Take by mouth daily.    Marland Kitchen VITAMIN D PO Take by mouth daily.     No current facility-administered medications for this visit.    PAST MEDICAL HISTORY: Past Medical History:  Diagnosis Date  . Complication of anesthesia    difficulty waking up after anesthesia  . Crohn disease ([place]) 08/28/2017  . Diabetes ([place])    type 2  . GERD (gastroesophageal reflux disease)   . H/O vitamin D deficiency   . High cholesterol   . History of kidney stones   . History of kidney stones   . Prostate cancer [facility])    Prostate    PAST SURGICAL HISTORY: Past Surgical History:  Procedure Laterality Date  . BIOPSY  12/23/2017   Procedure: BIOPSY;  Surgeon: Gatha Mayer, MD;  [facility];  Service: Endoscopy;;  . COLONOSCOPY  2013  . COLONOSCOPY WITH PROPOFOL N/A 12/23/2017   Procedure: COLONOSCOPY WITH PROPOFOL;  Surgeon: Gatha Mayer, MD;  [facility];  Service: Endoscopy;  Laterality:  N/A;  . CYSTOSCOPY W/ URETERAL STENT PLACEMENT Right 09/16/2018   Procedure: CYSTOSCOPY WITH RETROGRADE PYELOGRAM/URETERAL STENT PLACEMENT;  Surgeon: Ceasar Mons, MD;  [facility];  Service: Urology;  Laterality: Right;  . EXTRACORPOREAL SHOCK WAVE LITHOTRIPSY Right 10/03/2018   Procedure: EXTRACORPOREAL SHOCK WAVE LITHOTRIPSY (ESWL);  Surgeon: Raynelle Bring, MD;  [facility];  Service: Urology;  Laterality: Right;  .  LITHOTRIPSY    . LYMPHADENECTOMY Bilateral 12/06/2017   Procedure: LYMPHADENECTOMY, PELVIC;  Surgeon: Raynelle Bring, MD;  [facility];  Service: Urology;  Laterality: Bilateral;  . PROSTATE BIOPSY N/A 09/28/2017   Procedure: BIOPSY TRANSRECTAL ULTRASONIC PROSTATE (TUBP);  Surgeon: Irine Seal, MD;  [facility];  Service: Urology;  Laterality: N/A;  . PROSTATECTOMY    . ROBOT ASSISTED LAPAROSCOPIC RADICAL PROSTATECTOMY N/A 12/06/2017   Procedure: XI ROBOTIC ASSISTED LAPAROSCOPIC RADICAL PROSTATECTOMY LEVEL 2;  Surgeon: Raynelle Bring, MD;  [facility];  Service: Urology;  Laterality: N/A;  . TOOTH EXTRACTION  1999    FAMILY HISTORY: Family History  Problem Relation Age of Onset  . Healthy Sister   . Heart disease Mother   . Alzheimer's disease Mother   . Other Father        MVA  . Heart disease Brother   . Heart attack Brother        age 17  . Diabetes Maternal Uncle   . Diabetes Paternal Aunt   . Healthy Brother   . Colon cancer Maternal Aunt   . Diabetes Mellitus II Paternal Uncle     SOCIAL HISTORY: Social History   Socioeconomic History  . Marital status: Married    Spouse name: Not on file  . Number of children: 3  . Years of education: Not on file  . Highest education level: Not on file  Occupational History  . Not on file  Tobacco Use  . Smoking status: Never Smoker  . Smokeless tobacco: Never Used  Substance and Sexual Activity  . Alcohol use: Never  . Drug use: Never  . Sexual activity: Not on file  Other Topics Concern  . Not on file  Social History Narrative   Married   [place] to [place] from [place] in 2018   3 daughters ([place], [place], [place]) 7 grandchildren   Non smoker, No EToH   Social Determinants of Health   Financial Resource Strain:   . Difficulty of Paying Living Expenses:   Food Insecurity:   . Worried About Charity fundraiser in the Last Year:   . Arboriculturist in the Last Year:   Transportation Needs:   .  Film/video editor (Medical):   Marland Kitchen Lack of Transportation (Non-Medical):   Physical Activity:   . Days of Exercise per Week:   . Minutes of Exercise per Session:   Stress:   . Feeling of Stress :   Social Connections:   . Frequency of Communication with Friends and Family:   . Frequency of Social Gatherings with Friends and Family:   . Attends Religious Services:   . Active Member of Clubs or Organizations:   . Attends Archivist Meetings:   Marland Kitchen Marital Status:   Intimate Partner Violence:   . Fear of Current or Ex-Partner:   . Emotionally Abused:   Marland Kitchen Physically Abused:   . Sexually Abused:      PHYSICAL EXAM   Vitals:   08/15/19 1119  BP: 129/84  Pulse: 67  Weight: 238 lb (108 kg)  Height: 5\' 7"  (1.702 m)    Not recorded      Body mass index is 37.28 kg/m.  PHYSICAL EXAMNIATION:  Gen: NAD, conversant, well nourised, well groomed                     Cardiovascular: Regular rate rhythm, no peripheral edema, warm, nontender. Eyes: Conjunctivae clear without exudates or hemorrhage Neck: Supple, no carotid bruits. Pulmonary: Clear to auscultation bilaterally   NEUROLOGICAL EXAM:  MENTAL STATUS: Speech:    Speech is normal; fluent and spontaneous with normal comprehension.  Cognition:     Orientation to time, place and person     Normal recent and remote memory     Normal Attention span and concentration     Normal Language, naming, repeating,spontaneous speech     Fund of knowledge   CRANIAL NERVES: CN II: Visual fields are full to confrontation. Pupils are round equal and briskly reactive to light. CN III, IV, VI: extraocular movement are normal. No ptosis. CN V: Facial sensation is intact to light touch CN VII: Face is symmetric with normal eye closure  CN VIII: Hearing is normal to causal conversation. CN IX, X: Phonation is normal. CN XI: Head turning and shoulder shrug are intact  MOTOR: There is no pronator drift of out-stretched arms.  Muscle bulk and tone are normal. Muscle strength is normal.  REFLEXES: Reflexes are 2+ and symmetric at the biceps, triceps, knees, and ankles. Plantar responses are flexor.  SENSORY: Intact to light touch, pinprick and vibratory sensation are intact in fingers and toes with exception of mildly decreased pinprick at right first 3 finger pads  COORDINATION: There is no trunk or limb dysmetria noted.  GAIT/STANCE: Posture is normal. Gait is steady with normal steps, base, arm swing, and turning. Heel and toe walking are normal. Tandem gait is normal.  Romberg is absent.   DIAGNOSTIC DATA (LABS, IMAGING, TESTING) - I reviewed patient records, labs, notes, testing and imaging myself where available.   ASSESSMENT AND PLAN  Nahome Hillier is a 64 y.o. male   Intermittent jerking movement,  From the description most consistent with myoclonic jerking,  Normal neurological examination  Laboratory evaluation showed no significant metabolic disarrangement,  After discussed with patient, will continue to observe for symptoms, return to clinic for worsening episode  Right hand paresthesia,  Only has mild sensory change at the right first 3 finger pads, most suggestive of mild right carpal tunnel syndromes,  No motor weakness   Marcial Pacas, M.D. Ph.D.  [facility] [address] [place], [place] [address] Ph: [phone] Fax: [phone]  CC: Curlene Labrum, MD

## 2019-09-02 ENCOUNTER — Ambulatory Visit (INDEPENDENT_AMBULATORY_CARE_PROVIDER_SITE_OTHER): Payer: Medicare Other | Admitting: Gastroenterology

## 2019-09-02 ENCOUNTER — Encounter (INDEPENDENT_AMBULATORY_CARE_PROVIDER_SITE_OTHER): Payer: Self-pay | Admitting: Gastroenterology

## 2019-09-02 ENCOUNTER — Other Ambulatory Visit: Payer: Self-pay

## 2019-09-02 ENCOUNTER — Ambulatory Visit (INDEPENDENT_AMBULATORY_CARE_PROVIDER_SITE_OTHER): Payer: BLUE CROSS/BLUE SHIELD | Admitting: Internal Medicine

## 2019-09-02 VITALS — BP 121/73 | HR 60 | Temp 97.1°F | Ht 67.0 in | Wt 238.0 lb

## 2019-09-02 DIAGNOSIS — K5000 Crohn's disease of small intestine without complications: Secondary | ICD-10-CM | POA: Diagnosis not present

## 2019-09-02 NOTE — Patient Instructions (Signed)
-  Labs today  -Continue medications at current dose  -Call w/ any symptoms prior to next follow up

## 2019-09-02 NOTE — Progress Notes (Signed)
Patient profile: Brett Trevino is a 64 y.o. male seen for f/up of Crohn's, last seen 03/2019 virtually.   History of Present Illness: Brett Trevino is seen today for follow-up of Crohn's disease, he was last seen 6 months ago (Dec 2020) and was having some issues with intermittent nausea/abd pain.  He also had some intermittent issues in March 2021 and was on a course of prednisone.  He is now off the prednisone.  He reports chronic intermittent issues for many years and feels these have occurred every few months but the last 2 episodes in December 2020 & March 2021 were much less severe than prior.  States he feels well between the episodes of nausea/cramping/diarrhea.  His baseline is 2-3 looser stools a day with some urgency but no issues with incontinence or rectal bleeding. Denies abd pain between episodes. His appetite is good currently.  He is compliant with his 6-MP at 75 mg a day.    Wt Readings from Last 3 Encounters:  09/02/19 238 lb (108 kg)  08/15/19 238 lb (108 kg)  10/03/18 219 lb 8 oz (99.6 kg)     Last Colonoscopy:  2019--rectal tenderness, surgically absent prostate, eroded nodular vascular decrease mucosa at TI representing likely Crohn's disease, diverticulosis sigmoid descending.      Past Medical History:  Past Medical History:  Diagnosis Date  . Complication of anesthesia    difficulty waking up after anesthesia  . Crohn disease ([place]) 08/28/2017  . Diabetes ([place])    type 2  . GERD (gastroesophageal reflux disease)   . H/O vitamin D deficiency   . High cholesterol   . History of kidney stones   . History of kidney stones   . Prostate cancer [facility])    Prostate    Problem List: Patient Active Problem List   Diagnosis Date Noted  . 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 Paresthesia 08/15/2019  . Ureteral stone 09/17/2018  . Ureteral stone with hydronephrosis 09/16/2018  . Crohn's ileitis, with intestinal obstruction ([place])   . SBO (small bowel  obstruction) ([place]) 12/19/2017  . Vomiting 12/19/2017  . Type 2 diabetes mellitus with hyperlipidemia ([place]) 12/19/2017  . Prostate cancer ([place]) 12/06/2017  . Crohn disease ([place]) 08/28/2017    Past Surgical History: Past Surgical History:  Procedure Laterality Date  . BIOPSY  12/23/2017   Procedure: BIOPSY;  Surgeon: Gatha Mayer, MD;  [facility];  Service: Endoscopy;;  . COLONOSCOPY  2013  . COLONOSCOPY WITH PROPOFOL N/A 12/23/2017   Procedure: COLONOSCOPY WITH PROPOFOL;  Surgeon: Gatha Mayer, MD;  [facility];  Service: Endoscopy;  Laterality: N/A;  . CYSTOSCOPY W/ URETERAL STENT PLACEMENT Right 09/16/2018   Procedure: CYSTOSCOPY WITH RETROGRADE PYELOGRAM/URETERAL STENT PLACEMENT;  Surgeon: Ceasar Mons, MD;  [facility];  Service: Urology;  Laterality: Right;  . EXTRACORPOREAL SHOCK WAVE LITHOTRIPSY Right 10/03/2018   Procedure: EXTRACORPOREAL SHOCK WAVE LITHOTRIPSY (ESWL);  Surgeon: Raynelle Bring, MD;  [facility];  Service: Urology;  Laterality: Right;  . LITHOTRIPSY    . LYMPHADENECTOMY Bilateral 12/06/2017   Procedure: LYMPHADENECTOMY, PELVIC;  Surgeon: Raynelle Bring, MD;  [facility];  Service: Urology;  Laterality: Bilateral;  . PROSTATE BIOPSY N/A 09/28/2017   Procedure: BIOPSY TRANSRECTAL ULTRASONIC PROSTATE (TUBP);  Surgeon: Irine Seal, MD;  [facility];  Service: Urology;  Laterality: N/A;  . PROSTATECTOMY    . ROBOT ASSISTED LAPAROSCOPIC RADICAL PROSTATECTOMY N/A 12/06/2017   Procedure: XI ROBOTIC ASSISTED LAPAROSCOPIC RADICAL PROSTATECTOMY LEVEL  2;  Surgeon: Raynelle Bring, MD;  [facility];  Service: Urology;  Laterality: N/A;  . TOOTH EXTRACTION  1999    Allergies: Allergies  Allergen Reactions  . Morphine And Related Nausea Only      Home Medications:  Current Outpatient Medications:  .  aspirin EC 81 MG tablet, Take 81 mg by mouth daily., Disp: , Rfl:  .  atorvastatin (LIPITOR) 10 MG tablet, Take 1  tablet (10 mg total) by mouth daily., Disp: , Rfl:  .  Calcium Citrate (CITRACAL PO), Take by mouth daily., Disp: , Rfl:  .  loperamide (IMODIUM) 2 MG capsule, Take 1 capsule (2 mg total) by mouth daily as needed for diarrhea or loose stools., Disp: , Rfl:  .  mercaptopurine (PURINETHOL) 50 MG tablet, Take 1.5 tablets (75 mg total) by mouth daily. GIVE ON AN EMPTY STOMACH 1 HOUR BEFORE OR 2 HOURS AFTER MEALS., Disp: 60 tablet, Rfl: 5 .  Multiple Vitamin (MULTI VITAMIN MENS PO), Take by mouth daily., Disp: , Rfl:  .  VITAMIN D PO, Take by mouth daily., Disp: , Rfl:    Family History: family history includes Alzheimer's disease in his mother; Colon cancer in his maternal aunt; Diabetes in his maternal uncle and paternal aunt; Diabetes Mellitus II in his paternal uncle; Healthy in his brother and sister; Heart attack in his brother; Heart disease in his brother and mother; Other in his father.    Maternal aunt with colon cancer age 37  Social History:   reports that he has never smoked. He has never used smokeless tobacco. He reports that he does not drink alcohol or use drugs.   Review of Systems: Constitutional: Denies weight loss/weight gain  Eyes: No changes in vision. ENT: No oral lesions, sore throat.  GI: see HPI.  Heme/Lymph: No easy bruising.  CV: No chest pain.  GU: No hematuria.  Integumentary: No rashes.  Neuro: No headaches.  Psych: No depression/anxiety.  Endocrine: No heat/cold intolerance.  Allergic/Immunologic: No urticaria.  Resp: No cough, SOB.  Musculoskeletal: No joint swelling.    Physical Examination: BP 121/73 (BP Location: Right Arm, Patient Position: Sitting, Cuff Size: Large)   Pulse 60   Temp (!) 97.1 F (36.2 C) (Temporal)   Ht '5\' 7"'  (1.702 m)   Wt 238 lb (108 kg)   BMI 37.28 kg/m  Gen: NAD, alert and oriented x 4 HEENT: PEERLA, EOMI, Neck: supple, no JVD Chest: CTA bilaterally, no wheezes, crackles, or other adventitious sounds CV: RRR, no  m/g/c/r Abd: soft, NT, ND, +BS in all four quadrants; no HSM, guarding, ridigity, or rebound tenderness Ext: no edema, well perfused with 2+ pulses, Skin: no rash or lesions noted on observed skin Lymph: no noted LAD  Data Reviewed:  06/2019-CMP normal except 126 glucose, CBC w/ abs lymphs 847, otw normal  MRCP 06/2019--IMPRESSION: No findings suspicious for acute inflammatory Crohn's disease. No evidence of bowel obstruction or stricture. Status post prostatectomy. No findings suspicious for recurrent or metastatic disease.   Assessment/Plan: Mr. Demetro is a 64 y.o. male with history of Crohn's disease  1.  Crohn's-maintained on 75 mg of 6-MP, last colonoscopy 2019, MRCP March 2021 without active disease, currently feeling well clinically but did have flare of symptoms in December 2020 & March 2021, he is due for routine labs and will add on CRP and sed rate.  Would also consider fecal calprotectin in future.  If flares become more frequent would consider evaluation endoscopically and possible change  in maintenance therapy-he will notify me w/ return of any flare like symptoms.   Follow-up in 6 months but recommended to call sooner with any issues.   Deacon was seen today for follow-up.  Diagnoses and all orders for this visit:  Crohn's disease of small intestine without complication ([place]) -     CBC with Differential -     COMPLETE METABOLIC PANEL WITH GFR -     Sed Rate (ESR) -     C-reactive protein        I personally performed the service, non-incident to. (WP)  Laurine Blazer, [facility]

## 2019-09-03 LAB — COMPLETE METABOLIC PANEL WITH GFR
AG Ratio: 1.4 (calc) (ref 1.0–2.5)
ALT: 20 U/L (ref 9–46)
AST: 22 U/L (ref 10–35)
Albumin: 3.8 g/dL (ref 3.6–5.1)
Alkaline phosphatase (APISO): 46 U/L (ref 35–144)
BUN: 8 mg/dL (ref 7–25)
CO2: 32 mmol/L (ref 20–32)
Calcium: 9.3 mg/dL (ref 8.6–10.3)
Chloride: 103 mmol/L (ref 98–110)
Creat: 1.01 mg/dL (ref 0.70–1.25)
GFR, Est African American: 90 mL/min/{1.73_m2} (ref >=60)
GFR, Est Non African American: 78 mL/min/{1.73_m2} (ref >=60)
Globulin: 2.7 g/dL (calc) (ref 1.9–3.7)
Glucose, Bld: 102 mg/dL (ref 65–139)
Potassium: 4.3 mmol/L (ref 3.5–5.3)
Sodium: 139 mmol/L (ref 135–146)
Total Bilirubin: 0.7 mg/dL (ref 0.2–1.2)
Total Protein: 6.5 g/dL (ref 6.1–8.1)

## 2019-09-03 LAB — CBC WITH DIFFERENTIAL/PLATELET
Absolute Monocytes: 504 cells/uL (ref 200–950)
Basophils Absolute: 50 cells/uL (ref 0–200)
Basophils Relative: 1.1 %
Eosinophils Absolute: 81 cells/uL (ref 15–500)
Eosinophils Relative: 1.8 %
HCT: 43.2 % (ref 38.5–50.0)
Hemoglobin: 14.7 g/dL (ref 13.2–17.1)
Lymphs Abs: 855 cells/uL (ref 850–3900)
MCH: 31.8 pg (ref 27.0–33.0)
MCHC: 34 g/dL (ref 32.0–36.0)
MCV: 93.5 fL (ref 80.0–100.0)
MPV: 10.4 fL (ref 7.5–12.5)
Monocytes Relative: 11.2 %
Neutro Abs: 3011 cells/uL (ref 1500–7800)
Neutrophils Relative %: 66.9 %
Platelets: 229 10*3/uL (ref 140–400)
RBC: 4.62 10*6/uL (ref 4.20–5.80)
RDW: 13.6 % (ref 11.0–15.0)
Total Lymphocyte: 19 %
WBC: 4.5 10*3/uL (ref 3.8–10.8)

## 2019-09-03 LAB — SEDIMENTATION RATE: Sed Rate: 11 mm/h (ref 0–20)

## 2019-09-03 LAB — C-REACTIVE PROTEIN: CRP: 0.7 mg/L (ref <8.0)

## 2019-09-23 IMAGING — CR DG CHEST 1V PORT
1 series · 1 of 1 positions shown · non-contrast
Comparison: None.

CLINICAL DATA: Nasogastric tube placement

EXAM:
PORTABLE CHEST 1 VIEW

[portable]
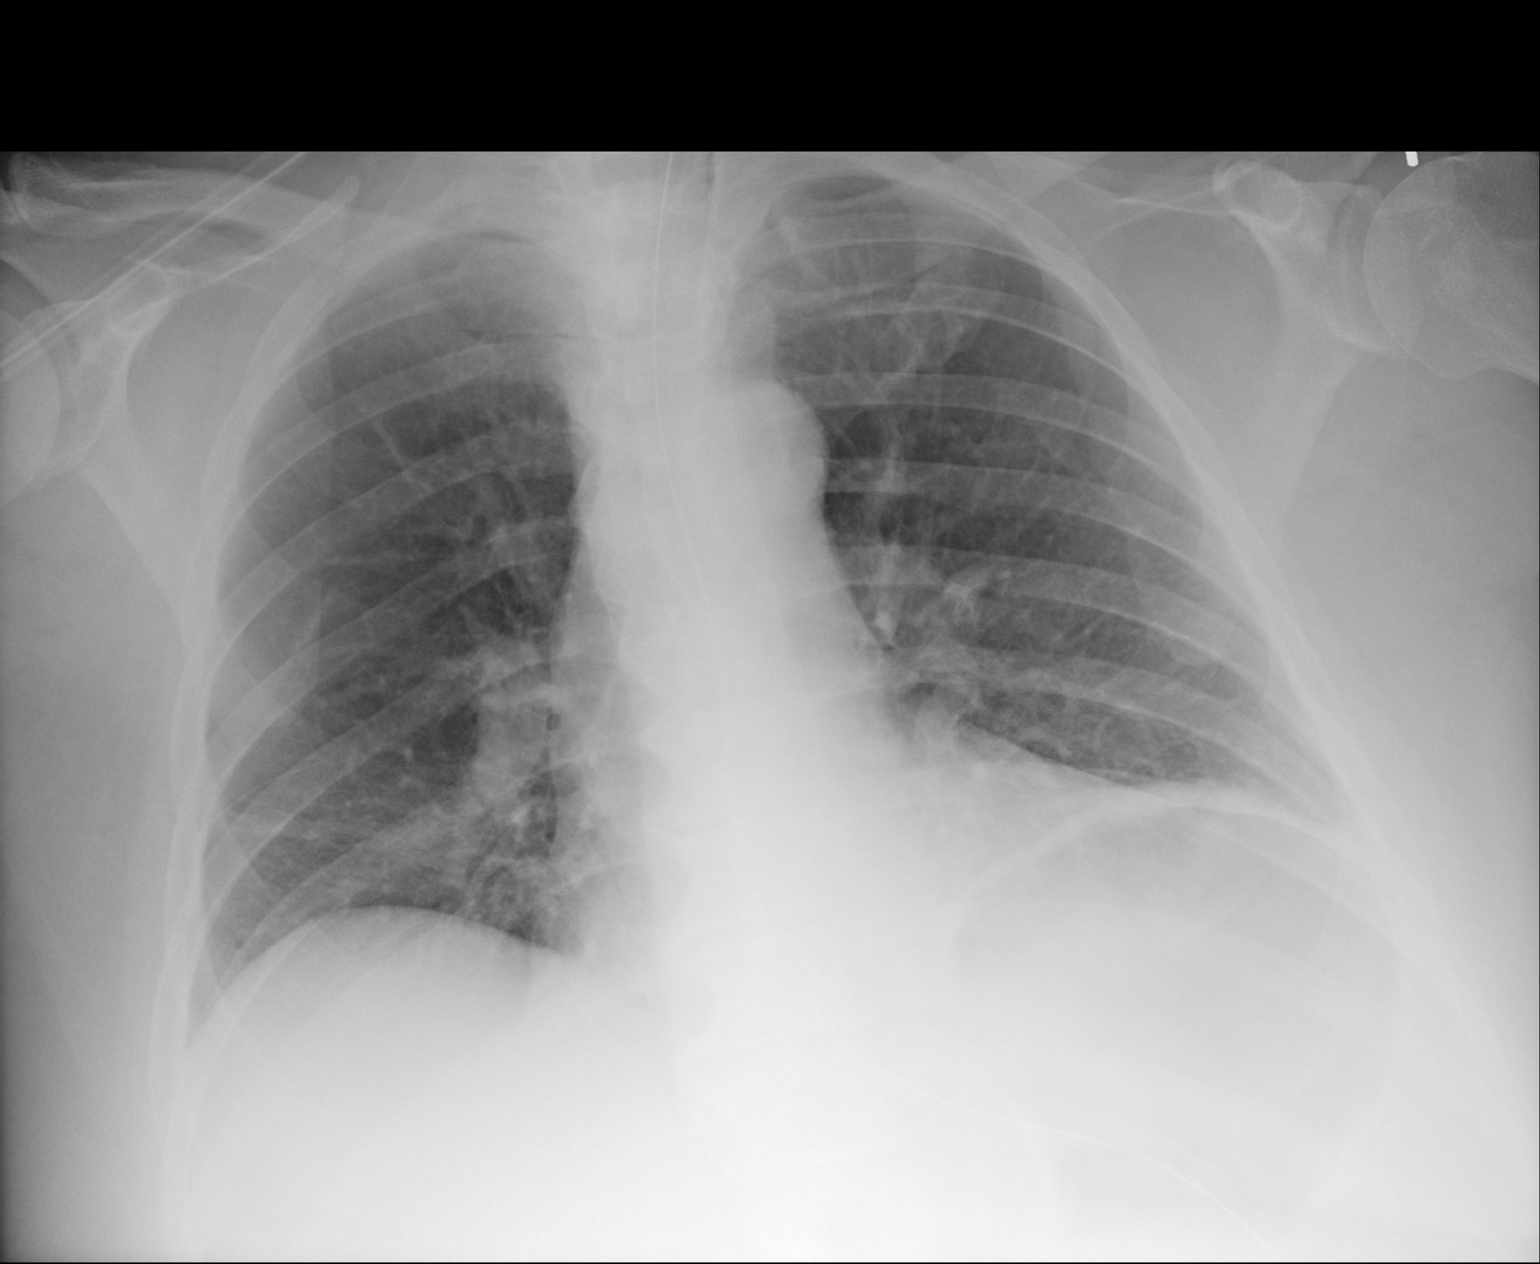

[1 of 1 positions shown; findings below may reference images not displayed]

FINDINGS: The side port of the nasogastric tube projects over the stomach.
There is shallow lung inflation with bibasilar atelectasis. No
pleural effusion or pneumothorax. Normal cardiomediastinal contours.
IMPRESSION: Nasogastric tube side port projects over the stomach.

## 2019-09-23 IMAGING — CT CT ABD-PELV W/ CM
2 of 5 series · 16 of 46 positions shown, 18 images · IV contrast (Isovue)
Comparison: 09/20/2017.

CLINICAL DATA: Vomiting and abdominal pain.

EXAM:
CT ABDOMEN AND PELVIS WITH CONTRAST
TECHNIQUE: Multidetector CT imaging of the abdomen and pelvis was performed
using the standard protocol following bolus administration of
intravenous contrast.
CONTRAST:  <See Chart> DRKP94-H00 IOPAMIDOL (DRKP94-H00) INJECTION
61%, 30mL DRKP94-H00 IOPAMIDOL (DRKP94-H00) INJECTION 61%, 100mL
DRKP94-H00 IOPAMIDOL (DRKP94-H00) INJECTION 61%

[Series 2: axial st · axial · 0.84mm/px · z∈[-529,-79]mm · 13 of 102 slices shown, 15 images]
[im 6/102  soft-tissue]
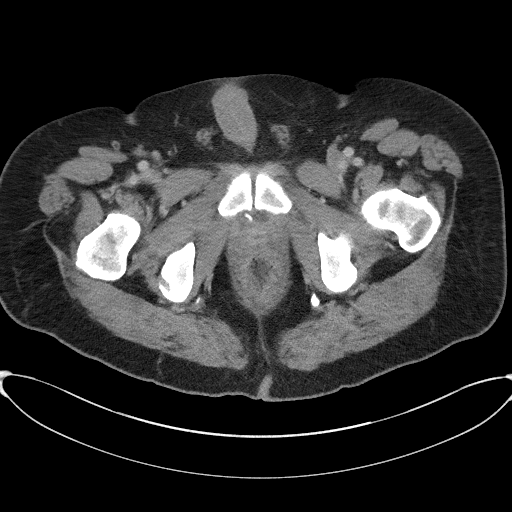
[im 6/102  bone]
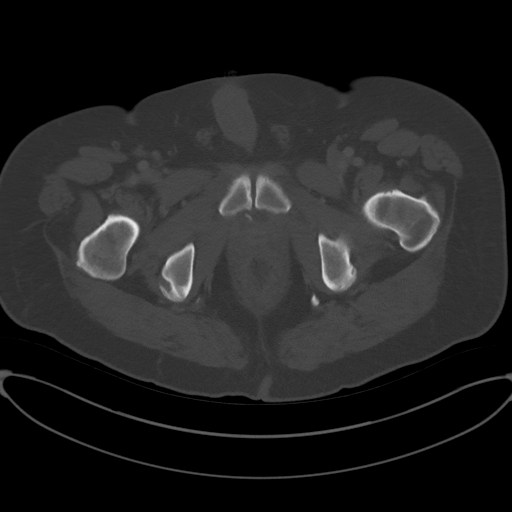
[im 16/102  soft-tissue]
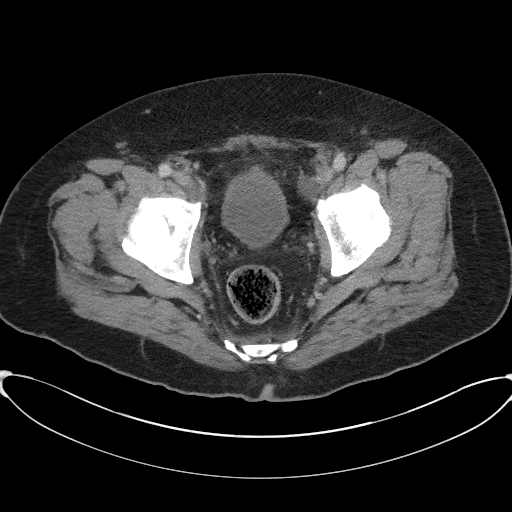
[im 22/102  soft-tissue]
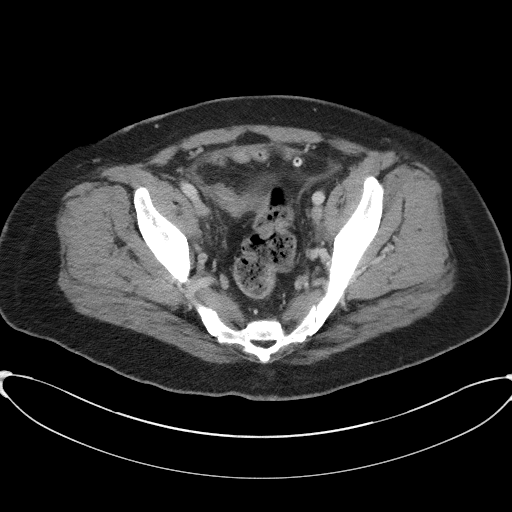
[im 27/102  soft-tissue]
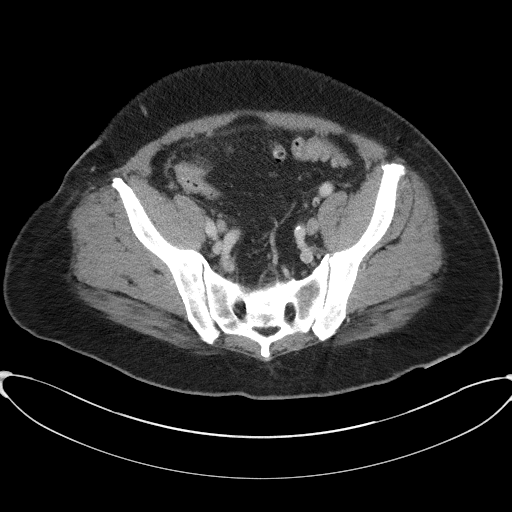
[im 38/102  soft-tissue]
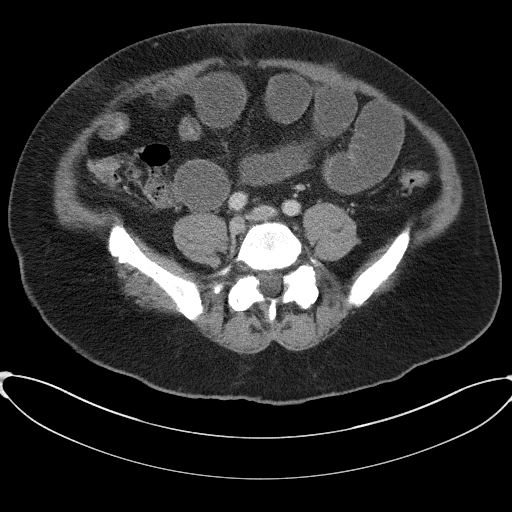
[im 43/102  soft-tissue]
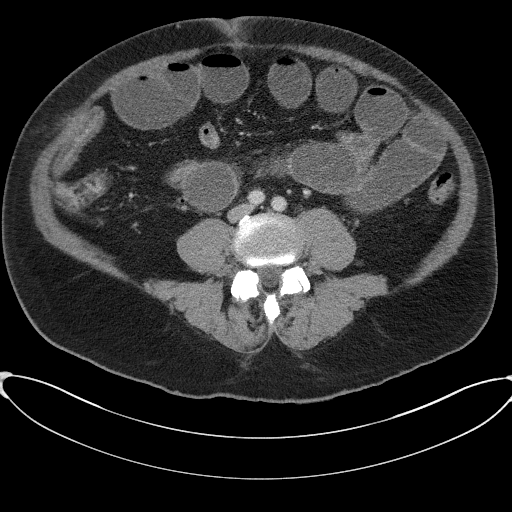
[im 54/102  soft-tissue]
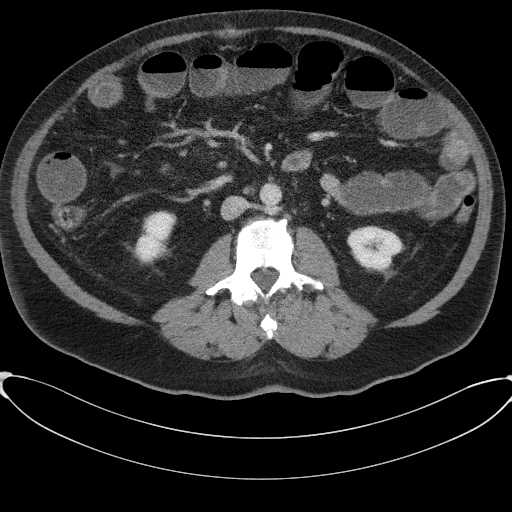
[im 59/102  soft-tissue]
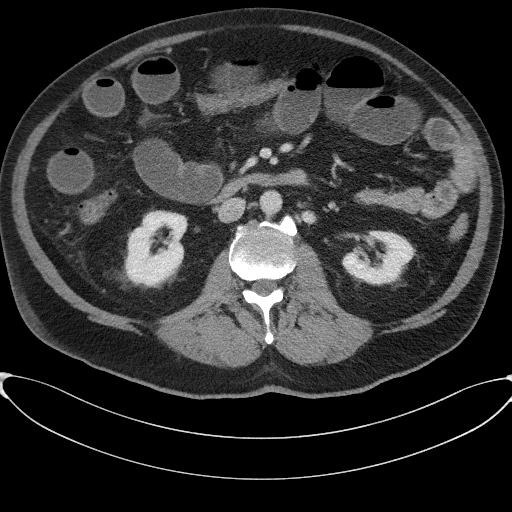
[im 64/102  soft-tissue]
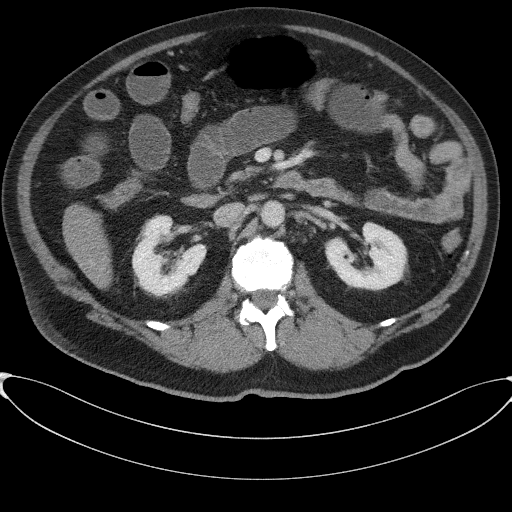
[im 64/102  bone]
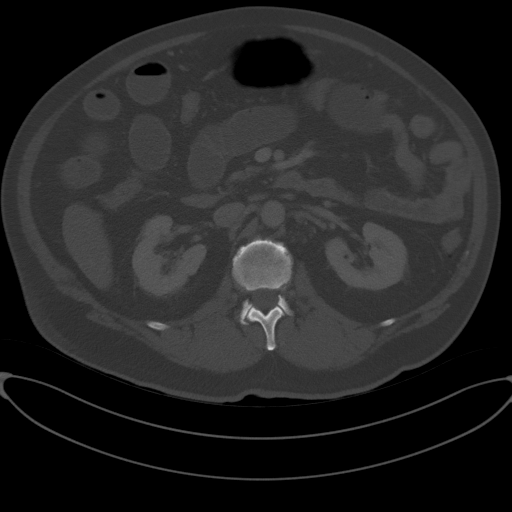
[im 75/102  soft-tissue]
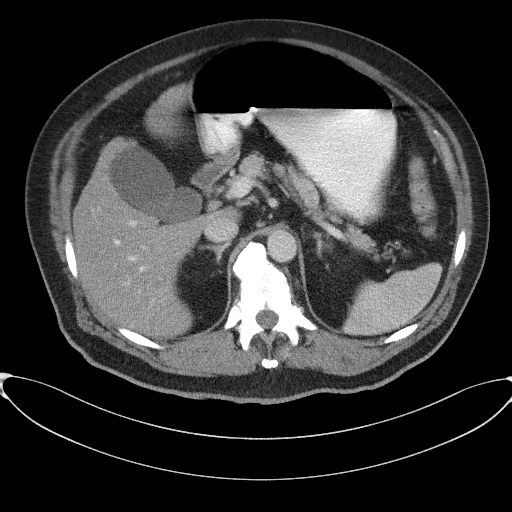
[im 80/102  soft-tissue]
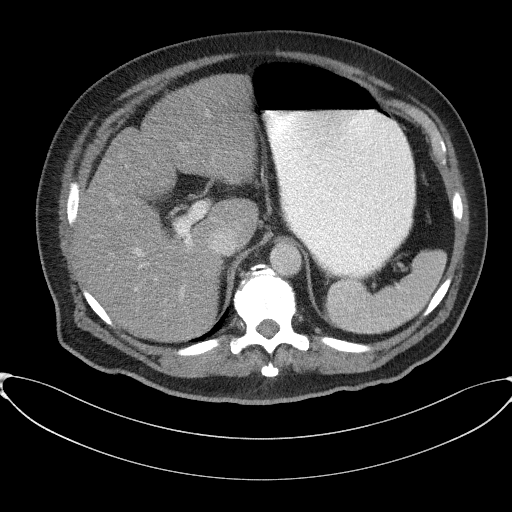
[im 86/102  soft-tissue]
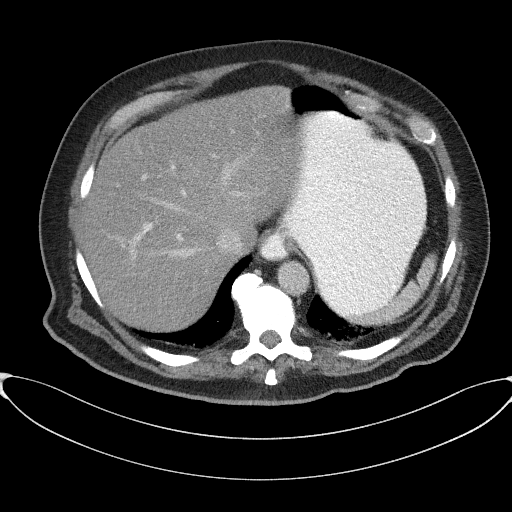
[im 96/102  soft-tissue]
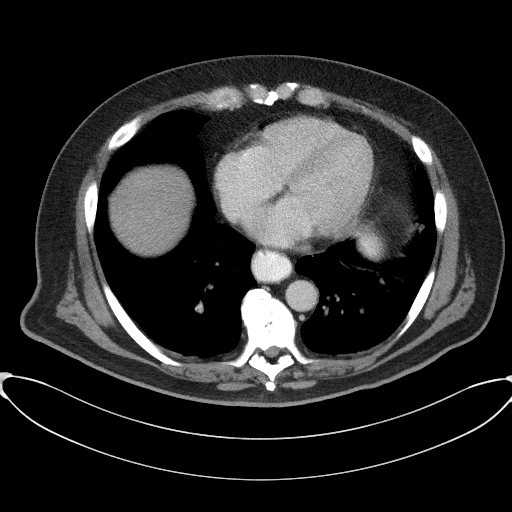

[Series 5: coronal st · coronal · 0.87mm/px · 3 of 117 slices shown]
[im 39/117  soft-tissue]
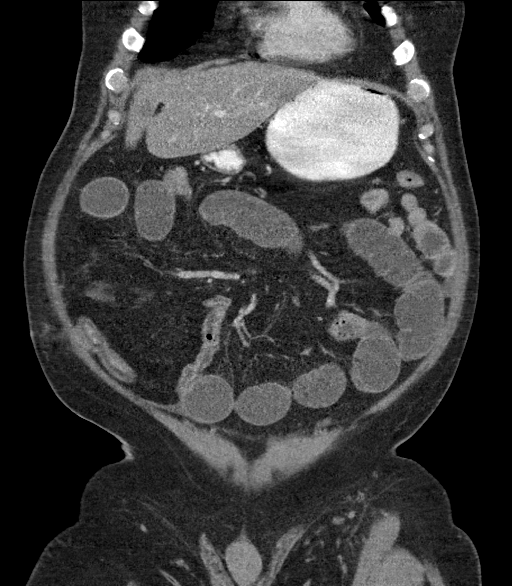
[im 52/117  soft-tissue]
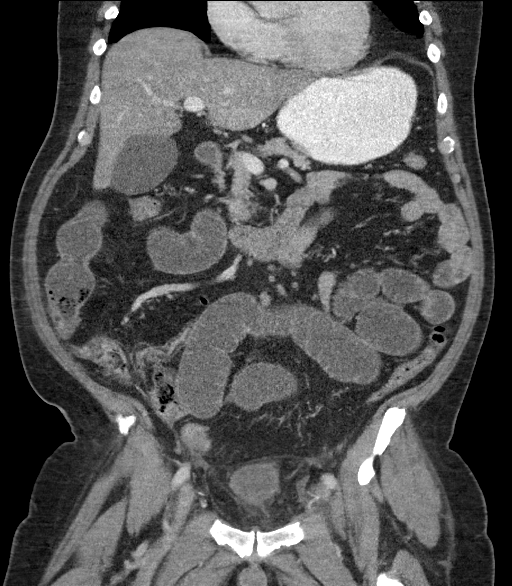
[im 65/117  soft-tissue]
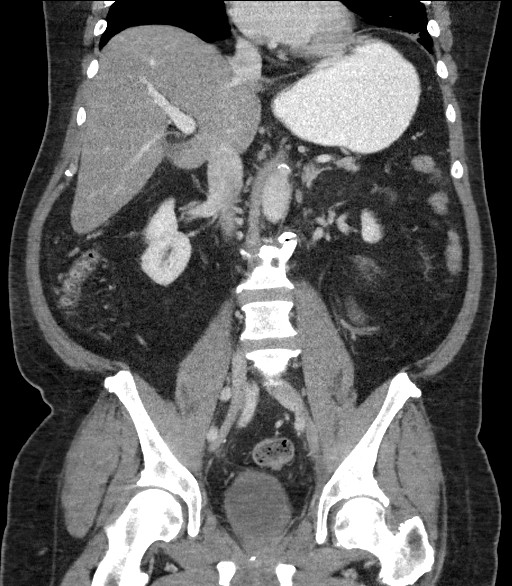

[16 of 46 positions shown; findings below may reference images not displayed]

FINDINGS: Lower chest: Lung bases show no acute findings. Heart is at the
upper limits of normal in size. No pericardial or pleural effusion.
Prepericardiac lymph nodes are subcentimeter in short axis size.
Distal esophagus is dilated and fluid-filled.

Hepatobiliary: Liver is slightly decreased in attenuation diffusely.
Liver and gallbladder are otherwise unremarkable. No biliary ductal
dilatation.

Pancreas: Negative.

Spleen: Negative.

Adrenals/Urinary Tract: Adrenal glands are unremarkable. Tiny stone
in the right kidney. Kidneys are otherwise unremarkable. Ureters are
decompressed. Bladder is low in volume.

Stomach/Bowel: Stomach is dilated and fluid-filled. Duodenum and
proximal jejunum are decompressed. There is dilated and fluid-filled
distal jejunum and ileum, to the level of an apparent small bowel
stricture in the right lower quadrant (series 2, image 60),
measuring approximately 6.7 cm. Remainder of the small bowel is
decompressed. Appendix and colon are unremarkable.

Vascular/Lymphatic: Atherosclerotic calcification of the arterial
vasculature without abdominal aortic aneurysm. Retroperitoneal lymph
nodes are not enlarged by CT size criteria. Circumaortic left renal
vein.

Reproductive: Prostatectomy.

Other: Mild presacral edema. Trace interloop fluid. Mesenteries and
peritoneum are otherwise unremarkable. No free air.

Musculoskeletal: Degenerative changes in the spine. No worrisome
lytic or sclerotic lesions.
IMPRESSION: 1. High-grade small bowel obstruction secondary to an apparent ileal
stricture in the right lower quadrant.
2. Hepatic steatosis.
3. Tiny right renal stone.
4.  Aortic atherosclerosis ([id number]-170.0).

## 2020-02-02 ENCOUNTER — Other Ambulatory Visit (INDEPENDENT_AMBULATORY_CARE_PROVIDER_SITE_OTHER): Payer: Self-pay | Admitting: Internal Medicine

## 2020-02-02 DIAGNOSIS — K5000 Crohn's disease of small intestine without complications: Secondary | ICD-10-CM

## 2020-02-02 NOTE — Telephone Encounter (Signed)
Last seen Thayer Headings 09/02/2019 [place]

## 2020-02-03 ENCOUNTER — Encounter (INDEPENDENT_AMBULATORY_CARE_PROVIDER_SITE_OTHER): Payer: Self-pay

## 2020-03-08 ENCOUNTER — Other Ambulatory Visit (INDEPENDENT_AMBULATORY_CARE_PROVIDER_SITE_OTHER): Payer: Self-pay | Admitting: *Deleted

## 2020-03-08 ENCOUNTER — Ambulatory Visit (INDEPENDENT_AMBULATORY_CARE_PROVIDER_SITE_OTHER): Payer: Medicare Other | Admitting: Gastroenterology

## 2020-03-08 ENCOUNTER — Encounter (INDEPENDENT_AMBULATORY_CARE_PROVIDER_SITE_OTHER): Payer: Self-pay | Admitting: Gastroenterology

## 2020-03-08 ENCOUNTER — Other Ambulatory Visit: Payer: Self-pay

## 2020-03-08 VITALS — BP 135/82 | HR 70 | Temp 98.1°F | Ht 67.0 in | Wt 246.0 lb

## 2020-03-08 DIAGNOSIS — R112 Nausea with vomiting, unspecified: Secondary | ICD-10-CM

## 2020-03-08 DIAGNOSIS — K5000 Crohn's disease of small intestine without complications: Secondary | ICD-10-CM

## 2020-03-08 DIAGNOSIS — M899 Disorder of bone, unspecified: Secondary | ICD-10-CM

## 2020-03-08 MED ORDER — MERCAPTOPURINE 50 MG PO TABS: 75.0000 mg | ORAL_TABLET | Freq: Every day | ORAL | 1 refills | Status: DC

## 2020-03-08 NOTE — Progress Notes (Signed)
[name], M.D. Gastroenterology & Hepatology [facility] [address] [place], [place] [address]  Primary Care Physician: Curlene Labrum, MD [place] [id number]  I will communicate my assessment and recommendations to the referring MD via EMR. "Note: Occasional unusual wording and randomly placed punctuation marks may result from the use of speech recognition technology to transcribe this document"  Problems: 1. Crohn's ileitis  History of Present Illness: Brett Trevino is a 64 y.o. male with PMh prostate cancer s/p surgery, DM, HTN HLD, GERD, and Crohn's disease of the small bowel, who presents for follow up of Crohn's ileitis.  The patient was last seen on 09/02/2019. At that time, the patient was continued on 6-MP 75 mg daily. Patient reports that he has been taking his medication compliantly.  The patient denies having any complaints reports feeling well while taking the medication. He has soft bowel movements, 2-3 BMs per day. Has had very occasional blood in his stool when he has to strain to have a BM sometimes the stool is slightly hard. Occasionally has diarrhea and has to take Imodium, which happens 2-3 times per month. Denies having any nausea, vomiting, fever, chills, hematochezia, melena, hematemesis, abdominal distention, abdominal pain, diarrhea, jaundice, pruritus or weight loss.  Patient reports that he had his last "flare" a year ago after he had some nausea and vomiting, was given a 2 week course of prednisone with resolution of his symptoms.  The patient was diagnosed with Crohn's disease in 2019 after presenting an episode of SBO. Had imaging in [place] concerning for a stricture at the TI. Colonoscopy performed at that time at [facility] showed some erosions and edema in the terminal ileum but biopsies were normal. Repeat MRI on 01/01/2018 showed some skip lesions in the small bowel, but no  stricture. Denies taking any NSAIDs.  Has been on 6MP since 2019, initially on 100 mg qday but had to decrease to 75 mg daily due to elevated liver enzymes.  Patient had mild COVID-19 in October 2021.  Last Colonoscopy: 2019 - as above  Last flu shot:October 2021 Last pneumonia shot: has received it in the past but does not remember when Last evaluation by dermatology: never Last zoster vaccine: never COVID-19 shot: Moderna vaccine x2 - more than 6 months ago  Past Medical History: Past Medical History:  Diagnosis Date  . Complication of anesthesia    difficulty waking up after anesthesia  . Crohn disease ([place]) 08/28/2017  . Diabetes ([place])    type 2  . GERD (gastroesophageal reflux disease)   . H/O vitamin D deficiency   . High cholesterol   . History of kidney stones   . History of kidney stones   . Prostate cancer [facility])    Prostate    Past Surgical History: Past Surgical History:  Procedure Laterality Date  . BIOPSY  12/23/2017   Procedure: BIOPSY;  Surgeon: Gatha Mayer, MD;  [facility]: [facility];  Service: Endoscopy;;  . COLONOSCOPY  2013  . COLONOSCOPY WITH PROPOFOL N/A 12/23/2017   Procedure: COLONOSCOPY WITH PROPOFOL;  Surgeon: Gatha Mayer, MD;  [facility];  Service: Endoscopy;  Laterality: N/A;  . CYSTOSCOPY W/ URETERAL STENT PLACEMENT Right 09/16/2018   Procedure: CYSTOSCOPY WITH RETROGRADE PYELOGRAM/URETERAL STENT PLACEMENT;  Surgeon: Ceasar Mons, MD;  [facility];  Service: Urology;  Laterality: Right;  . EXTRACORPOREAL SHOCK WAVE LITHOTRIPSY Right 10/03/2018   Procedure: EXTRACORPOREAL SHOCK WAVE  LITHOTRIPSY (ESWL);  Surgeon: Raynelle Bring, MD;  [facility];  Service: Urology;  Laterality: Right;  . LITHOTRIPSY    . LYMPHADENECTOMY Bilateral 12/06/2017   Procedure: LYMPHADENECTOMY, PELVIC;  Surgeon: Raynelle Bring, MD;  [facility];  Service: Urology;  Laterality: Bilateral;  . PROSTATE BIOPSY N/A 09/28/2017    Procedure: BIOPSY TRANSRECTAL ULTRASONIC PROSTATE (TUBP);  Surgeon: Irine Seal, MD;  [facility]: [facility];  Service: Urology;  Laterality: N/A;  . PROSTATECTOMY    . ROBOT ASSISTED LAPAROSCOPIC RADICAL PROSTATECTOMY N/A 12/06/2017   Procedure: XI ROBOTIC ASSISTED LAPAROSCOPIC RADICAL PROSTATECTOMY LEVEL 2;  Surgeon: Raynelle Bring, MD;  [facility];  Service: Urology;  Laterality: N/A;  . TOOTH EXTRACTION  1999    Family History: Family History  Problem Relation Age of Onset  . Healthy Sister   . Heart disease Mother   . Alzheimer's disease Mother   . Other Father        MVA  . Heart disease Brother   . Heart attack Brother        age 7  . Diabetes Maternal Uncle   . Diabetes Paternal Aunt   . Healthy Brother   . Colon cancer Maternal Aunt   . Diabetes Mellitus II Paternal Uncle     Social History: Social History   Tobacco Use  Smoking Status Never Smoker  Smokeless Tobacco Never Used   Social History   Substance and Sexual Activity  Alcohol Use Never   Social History   Substance and Sexual Activity  Drug Use Never    Allergies: Allergies  Allergen Reactions  . Morphine And Related Nausea Only    Medications: Current Outpatient Medications  Medication Sig Dispense Refill  . aspirin EC 81 MG tablet Take 81 mg by mouth daily.    Marland Kitchen atorvastatin (LIPITOR) 10 MG tablet Take 1 tablet (10 mg total) by mouth daily.    . Calcium Citrate (CITRACAL PO) Take by mouth daily.    Marland Kitchen loperamide (IMODIUM) 2 MG capsule Take 1 capsule (2 mg total) by mouth daily as needed for diarrhea or loose stools.    . mercaptopurine (PURINETHOL) 50 MG tablet TAKE 1 & 1/2 (ONE & ONE-HALF) TABLETS BY MOUTH ONCE DAILY TAKE  ON  AN  EMPTY  STOMACH  1  HOUR  BEFORE  OR  2  HOURS  AFTER  MEALS 60 tablet 0  . metFORMIN (GLUCOPHAGE) 500 MG tablet Take 500 mg by mouth daily with breakfast.    . Multiple Vitamin (MULTI VITAMIN MENS PO) Take by mouth daily.    Marland Kitchen VITAMIN D PO Take by mouth daily.      No current facility-administered medications for this visit.    Review of Systems: GENERAL: negative for malaise, night sweats HEENT: No changes in hearing or vision, no nose bleeds or other nasal problems. NECK: Negative for lumps, goiter, pain and significant neck swelling RESPIRATORY: Negative for cough, wheezing CARDIOVASCULAR: Negative for chest pain, leg swelling, palpitations, orthopnea GI: SEE HPI MUSCULOSKELETAL: Negative for joint pain or swelling, back pain, and muscle pain. SKIN: Negative for lesions, rash PSYCH: Negative for sleep disturbance, mood disorder and recent psychosocial stressors. HEMATOLOGY Negative for prolonged bleeding, bruising easily, and swollen nodes. ENDOCRINE: Negative for cold or heat intolerance, polyuria, polydipsia and goiter. NEURO: negative for tremor, gait imbalance, syncope and seizures. The remainder of the review of systems is noncontributory.   Physical Exam: BP 135/82 (BP Location: Left Arm, Patient Position: Sitting, Cuff Size: Large)  Pulse 70   Temp 98.1 F (36.7 C) (Oral)   Ht 5\' 7"  (1.702 m)   Wt 246 lb (111.6 kg)   BMI 38.53 kg/m  GENERAL: The patient is AO x3, in no acute distress. Obese. HEENT: Head is normocephalic and atraumatic. EOMI are intact. Mouth is well hydrated and without lesions. NECK: Supple. No masses LUNGS: Clear to auscultation. No presence of rhonchi/wheezing/rales. Adequate chest expansion HEART: RRR, normal s1 and s2. ABDOMEN: Soft, nontender, no guarding, no peritoneal signs, and nondistended. BS +. No masses. EXTREMITIES: Without any cyanosis, clubbing, rash, lesions or edema. NEUROLOGIC: AOx3, no focal motor deficit. SKIN: no jaundice, no rashes  Imaging/Labs: as above  I personally reviewed and interpreted the available labs, imaging and endoscopic files.  Impression and Plan: Vestal Markin is a 64 y.o. male with PMh prostate cancer s/p surgery, DM, HTN HLD, GERD, and Crohn's disease of the  small bowel, who presents for follow up of Crohn's ileitis.  The patient has presented clinical remission while on 6-MP.  In fact, he had imaging findings consistent with biologic remission.  As he has not required any recent courses of prednisone, I will consider that his medication is currently controlling his disease.  We will need to obtain surveillance labs today and will order fecal calprotectin, although it may be normal in small bowel disease in some cases.  Regarding preventive measures, the patient was advised to get the booster shot for COVID-19 and to obtain the Shingrix vaccine for herpes zoster.  Finally, we will request dermatology referral for yearly evaluation.  Colonoscopy is not warranted at this point it but will consider it in the future.  - Continue 6-MP 75 mg daily - Check CBC, CMP, B12, Vitamin D, CRP - Check fecal calprotectin - Ask PCP about Shingrix vaccine and Booster shot for COVID - Dermatology referral - RTC 6 months  All questions were answered.      Harvel Quale, MD Gastroenterology and Hepatology [facility]

## 2020-03-08 NOTE — Patient Instructions (Addendum)
Continue 6-MP 75 mg daily Perform blood workup every 3 months Perform stool workup Ask PCP about Shingrix vaccine and Booster shot for [facility] referral

## 2020-03-09 ENCOUNTER — Ambulatory Visit (INDEPENDENT_AMBULATORY_CARE_PROVIDER_SITE_OTHER): Payer: Medicare Other | Admitting: Internal Medicine

## 2020-03-09 LAB — CBC WITH DIFFERENTIAL/PLATELET
Absolute Monocytes: 343 cells/uL (ref 200–950)
Basophils Absolute: 39 cells/uL (ref 0–200)
Basophils Relative: 1 %
Eosinophils Absolute: 90 cells/uL (ref 15–500)
Eosinophils Relative: 2.3 %
HCT: 41.4 % (ref 38.5–50.0)
Hemoglobin: 14.1 g/dL (ref 13.2–17.1)
Lymphs Abs: 702 cells/uL — ABNORMAL LOW (ref 850–3900)
MCH: 32.3 pg (ref 27.0–33.0)
MCHC: 34.1 g/dL (ref 32.0–36.0)
MCV: 95 fL (ref 80.0–100.0)
MPV: 10.2 fL (ref 7.5–12.5)
Monocytes Relative: 8.8 %
Neutro Abs: 2726 cells/uL (ref 1500–7800)
Neutrophils Relative %: 69.9 %
Platelets: 237 10*3/uL (ref 140–400)
RBC: 4.36 10*6/uL (ref 4.20–5.80)
RDW: 14.1 % (ref 11.0–15.0)
Total Lymphocyte: 18 %
WBC: 3.9 10*3/uL (ref 3.8–10.8)

## 2020-03-09 LAB — C-REACTIVE PROTEIN: CRP: 1.5 mg/L (ref <8.0)

## 2020-03-09 LAB — COMPLETE METABOLIC PANEL WITH GFR
AG Ratio: 1.5 (calc) (ref 1.0–2.5)
ALT: 23 U/L (ref 9–46)
AST: 23 U/L (ref 10–35)
Albumin: 3.8 g/dL (ref 3.6–5.1)
Alkaline phosphatase (APISO): 49 U/L (ref 35–144)
BUN: 9 mg/dL (ref 7–25)
CO2: 31 mmol/L (ref 20–32)
Calcium: 9 mg/dL (ref 8.6–10.3)
Chloride: 105 mmol/L (ref 98–110)
Creat: 0.97 mg/dL (ref 0.70–1.25)
GFR, Est African American: 94 mL/min/{1.73_m2} (ref >=60)
GFR, Est Non African American: 81 mL/min/{1.73_m2} (ref >=60)
Globulin: 2.5 g/dL (calc) (ref 1.9–3.7)
Glucose, Bld: 160 mg/dL — ABNORMAL HIGH (ref 65–139)
Potassium: 4.3 mmol/L (ref 3.5–5.3)
Sodium: 140 mmol/L (ref 135–146)
Total Bilirubin: 0.6 mg/dL (ref 0.2–1.2)
Total Protein: 6.3 g/dL (ref 6.1–8.1)

## 2020-03-09 LAB — VITAMIN D 25 HYDROXY (VIT D DEFICIENCY, FRACTURES): Vit D, 25-Hydroxy: 34 ng/mL (ref 30–100)

## 2020-03-09 LAB — VITAMIN B12: Vitamin B-12: 360 pg/mL (ref 200–1100)

## 2020-03-16 LAB — CALPROTECTIN: Calprotectin: 341 mcg/g — ABNORMAL HIGH

## 2020-03-17 ENCOUNTER — Telehealth (INDEPENDENT_AMBULATORY_CARE_PROVIDER_SITE_OTHER): Payer: Self-pay | Admitting: Gastroenterology

## 2020-03-17 NOTE — Telephone Encounter (Signed)
I called the patient and the patient's wife to discuss the results of his fecal calprotectin that shows an elevated number, more than 150 which is suspicious for increase endoscopic activity of his Crohn's disease.  Due to this, it would be important to have a repeat colonoscopy to evaluate his small bowel and determine if his current therapy with 6-MP is controlling his disease.  Unfortunately, I was not able to get a hold of the patient or his wife and I left a detailed voice message to both to discuss the need of performing a repeat colonoscopy.  If the patient calls back we will need to order a colonoscopy if he agrees so.  [name], MD Gastroenterology and Hepatology [facility]

## 2020-03-19 ENCOUNTER — Other Ambulatory Visit (INDEPENDENT_AMBULATORY_CARE_PROVIDER_SITE_OTHER): Payer: Self-pay | Admitting: Gastroenterology

## 2020-03-19 ENCOUNTER — Telehealth (INDEPENDENT_AMBULATORY_CARE_PROVIDER_SITE_OTHER): Payer: Self-pay | Admitting: Gastroenterology

## 2020-03-19 NOTE — Telephone Encounter (Signed)
Spoke to the patient, he understood about the need to perform colonoscopy with TI intubation to evaluate the activity of his disease as he had elevated calprotectin recently.  Brett Trevino, please schedule him for colonoscopy. Room 1, diagnosis Crohn's disease.  Thanks

## 2020-03-19 NOTE — Telephone Encounter (Signed)
Patient called the office regarding the telephone message Dr Jenetta Downer left regarding a repeat colonoscopy - see Dr Lurline Del note

## 2020-03-19 NOTE — Telephone Encounter (Signed)
Talked with the patient. Explained your message to him. He states that if you feel that he needs to have a TCS he will agree to do this. If you would like to talk further with him , please call.  Please advise.

## 2020-03-23 ENCOUNTER — Telehealth (INDEPENDENT_AMBULATORY_CARE_PROVIDER_SITE_OTHER): Payer: Self-pay

## 2020-03-23 ENCOUNTER — Encounter (INDEPENDENT_AMBULATORY_CARE_PROVIDER_SITE_OTHER): Payer: Self-pay

## 2020-03-23 ENCOUNTER — Other Ambulatory Visit (INDEPENDENT_AMBULATORY_CARE_PROVIDER_SITE_OTHER): Payer: Self-pay

## 2020-03-23 MED ORDER — PLENVU 140 G PO SOLR: 140.0000 g | Freq: Once | ORAL | 0 refills | Status: AC

## 2020-03-23 NOTE — Telephone Encounter (Signed)
LeighAnn Tuyet Bader, CMA  

## 2020-03-24 NOTE — Telephone Encounter (Signed)
Patient is scheduled on 04/21/20 and he is aware

## 2020-04-04 NOTE — Telephone Encounter (Signed)
Thanks

## 2020-04-20 ENCOUNTER — Other Ambulatory Visit (HOSPITAL_COMMUNITY)
Admission: RE | Admit: 2020-04-20 | Discharge: 2020-04-20 | Disposition: A | Discharge status: Home / Self Care | Payer: Medicare Other | Source: Ambulatory Visit | Attending: Gastroenterology | Admitting: Gastroenterology

## 2020-04-20 ENCOUNTER — Other Ambulatory Visit: Payer: Self-pay

## 2020-04-20 ENCOUNTER — Encounter (HOSPITAL_COMMUNITY): Payer: Self-pay | Admitting: Gastroenterology

## 2020-04-20 DIAGNOSIS — Z20822 Contact with and (suspected) exposure to covid-19: Secondary | ICD-10-CM | POA: Diagnosis not present

## 2020-04-20 DIAGNOSIS — Z01812 Encounter for preprocedural laboratory examination: Secondary | ICD-10-CM | POA: Insufficient documentation

## 2020-04-20 LAB — SARS CORONAVIRUS 2 (TAT 6-24 HRS): SARS Coronavirus 2: NEGATIVE

## 2020-04-21 ENCOUNTER — Encounter (HOSPITAL_COMMUNITY): Payer: Self-pay | Admitting: Gastroenterology

## 2020-04-21 ENCOUNTER — Other Ambulatory Visit: Payer: Self-pay

## 2020-04-21 ENCOUNTER — Encounter (INDEPENDENT_AMBULATORY_CARE_PROVIDER_SITE_OTHER): Payer: Self-pay | Admitting: *Deleted

## 2020-04-21 ENCOUNTER — Encounter (HOSPITAL_COMMUNITY)
Admission: RE | Disposition: A | Discharge status: Home / Self Care | Payer: Self-pay | Source: Home / Self Care | Attending: Gastroenterology

## 2020-04-21 ENCOUNTER — Ambulatory Visit (HOSPITAL_COMMUNITY): Payer: Medicare Other | Admitting: Anesthesiology

## 2020-04-21 ENCOUNTER — Ambulatory Visit (HOSPITAL_COMMUNITY)
Admission: RE | Admit: 2020-04-21 | Discharge: 2020-04-21 | Disposition: A | Discharge status: Home / Self Care | Payer: Medicare Other | Attending: Gastroenterology | Admitting: Gastroenterology

## 2020-04-21 DIAGNOSIS — K50011 Crohn's disease of small intestine with rectal bleeding: Secondary | ICD-10-CM | POA: Diagnosis not present

## 2020-04-21 DIAGNOSIS — I10 Essential (primary) hypertension: Secondary | ICD-10-CM | POA: Diagnosis not present

## 2020-04-21 DIAGNOSIS — K648 Other hemorrhoids: Secondary | ICD-10-CM

## 2020-04-21 DIAGNOSIS — K5090 Crohn's disease, unspecified, without complications: Secondary | ICD-10-CM | POA: Diagnosis not present

## 2020-04-21 DIAGNOSIS — Z7984 Long term (current) use of oral hypoglycemic drugs: Secondary | ICD-10-CM | POA: Diagnosis not present

## 2020-04-21 DIAGNOSIS — Z79899 Other long term (current) drug therapy: Secondary | ICD-10-CM | POA: Diagnosis not present

## 2020-04-21 DIAGNOSIS — R195 Other fecal abnormalities: Secondary | ICD-10-CM | POA: Diagnosis not present

## 2020-04-21 DIAGNOSIS — E119 Type 2 diabetes mellitus without complications: Secondary | ICD-10-CM | POA: Diagnosis not present

## 2020-04-21 DIAGNOSIS — K5730 Diverticulosis of large intestine without perforation or abscess without bleeding: Secondary | ICD-10-CM

## 2020-04-21 DIAGNOSIS — E785 Hyperlipidemia, unspecified: Secondary | ICD-10-CM | POA: Diagnosis not present

## 2020-04-21 DIAGNOSIS — Z8546 Personal history of malignant neoplasm of prostate: Secondary | ICD-10-CM | POA: Diagnosis not present

## 2020-04-21 DIAGNOSIS — Z7982 Long term (current) use of aspirin: Secondary | ICD-10-CM | POA: Diagnosis not present

## 2020-04-21 HISTORY — PX: COLONOSCOPY WITH PROPOFOL: SHX5780

## 2020-04-21 LAB — GLUCOSE, CAPILLARY: Glucose-Capillary: 137 mg/dL — ABNORMAL HIGH (ref 70–99)

## 2020-04-21 LAB — HM COLONOSCOPY

## 2020-04-21 SURGERY — COLONOSCOPY WITH PROPOFOL
Anesthesia: General

## 2020-04-21 MED ORDER — LACTATED RINGERS IV SOLN: INTRAVENOUS | Status: DC

## 2020-04-21 MED ORDER — PROPOFOL 500 MG/50ML IV EMUL
INTRAVENOUS | Status: DC | PRN
  Administered 2020-04-21: 150 ug/kg/min via INTRAVENOUS

## 2020-04-21 MED ORDER — STERILE WATER FOR IRRIGATION IR SOLN
Status: DC | PRN
  Administered 2020-04-21: 200 mL

## 2020-04-21 MED ORDER — PROPOFOL 10 MG/ML IV BOLUS
INTRAVENOUS | Status: DC | PRN
  Administered 2020-04-21: 50 mg via INTRAVENOUS

## 2020-04-21 NOTE — Transfer of Care (Signed)
Immediate Anesthesia Transfer of Care Note  Patient: Brett Trevino  Procedure(s) Performed: COLONOSCOPY WITH PROPOFOL (N/A )  Patient Location: PACU  Anesthesia Type:General  Level of Consciousness: awake, alert , oriented and patient cooperative  Airway & Oxygen Therapy: Patient Spontanous Breathing  Post-op Assessment: Report given to RN, Post -op Vital signs reviewed and stable and Patient moving all extremities X 4  Post vital signs: Reviewed and stable  Last Vitals:  Vitals Value Taken Time  BP    Temp    Pulse    Resp    SpO2      Last Pain:  Vitals:   04/21/20 0937  TempSrc:   PainSc: 0-No pain         Complications: No complications documented.

## 2020-04-21 NOTE — Discharge Instructions (Signed)
Colonoscopy, Adult, Care After This sheet gives you information about how to care for yourself after your procedure. Your doctor may also give you more specific instructions. If you have problems or questions, call your doctor. What can I expect after the procedure? After the procedure, it is common to have:  A small amount of blood in your poop (stool) for 24 hours.  Some gas.  Mild cramping or bloating in your belly (abdomen). Follow these instructions at home: Eating and drinking  Drink enough fluid to keep your pee (urine) pale yellow.  Follow instructions from your doctor about what you cannot eat or drink.  Return to your normal diet as told by your doctor. Avoid heavy or fried foods that are hard to digest.   Activity  Rest as told by your doctor.  Do not sit for a long time without moving. Get up to take short walks every 1-2 hours. This is important. Ask for help if you feel weak or unsteady.  Return to your normal activities as told by your doctor. Ask your doctor what activities are safe for you. To help cramping and bloating:  Try walking around.  Put heat on your belly as told by your doctor. Use the heat source that your doctor recommends, such as a moist heat pack or a heating pad. ? Put a towel between your skin and the heat source. ? Leave the heat on for 20-30 minutes. ? Remove the heat if your skin turns bright red. This is very important if you are unable to feel pain, heat, or cold. You may have a greater risk of getting burned.   General instructions  If you were given a medicine to help you relax (sedative) during your procedure, it can affect you for many hours. Do not drive or use machinery until your doctor says that it is safe.  For the first 24 hours after the procedure: ? Do not sign important documents. ? Do not drink alcohol. ? Do your daily activities more slowly than normal. ? Eat foods that are soft and easy to digest.  Take  over-the-counter or prescription medicines only as told by your doctor.  Keep all follow-up visits as told by your doctor. This is important. Contact a doctor if:  You have blood in your poop 2-3 days after the procedure. Get help right away if:  You have more than a small amount of blood in your poop.  You see large clumps of tissue (blood clots) in your poop.  Your belly is swollen.  You feel like you may vomit (nauseous).  You vomit.  You have a fever.  You have belly pain that gets worse, and medicine does not help your pain. Summary  After the procedure, it is common to have a small amount of blood in your poop. You may also have mild cramping and bloating in your belly.  If you were given a medicine to help you relax (sedative) during your procedure, it can affect you for many hours. Do not drive or use machinery until your doctor says that it is safe.  Get help right away if you have a lot of blood in your poop, feel like you may vomit, have a fever, or have more belly pain. This information is not intended to replace advice given to you by your health care provider. Make sure you discuss any questions you have with your health care provider. Document Revised: 01/24/2019 Document Reviewed: 10/14/2018 Elsevier Patient Education  2021  Natalia are being discharged to home.  Resume your previous diet.  Continue your present medications.  Your physician has recommended a repeat colonoscopy in 10 years for screening purposes.

## 2020-04-21 NOTE — H&P (Signed)
Brett Trevino is an 65 y.o. male.   Chief Complaint: Crohn;s disease HPI: Brett Trevino is a 65 y.o. male with PMh prostate cancer s/p surgery, DM, HTN HLD, GERD, and Crohn's disease of the small bowel, who presents for endoscopic evaluation  of Crohn's ileitis.  Patient reports having 2-3 soft to watery bowel movements every day.  Denies having abdominal pain.  Has seen intermittently fresh blood in his stool multivariate had a large amount and monitor his bowel movements.  Denies having any abdominal pain or weight loss.  No other complaints otherwise.  Notably, the last Fecal calprotectin that the patient performed was elevated, for which a colonoscopy was ordered.  Last Colonoscopy 2019 performed at that time at [facility] showed some erosions and edema in the terminal ileum but biopsies were normal   Past Medical History:  Diagnosis Date  . Complication of anesthesia    difficulty waking up after anesthesia  . Crohn disease ([place]) 08/28/2017  . Diabetes ([place])    type 2  . GERD (gastroesophageal reflux disease)   . H/O vitamin D deficiency   . High cholesterol   . History of kidney stones   . History of kidney stones   . Prostate cancer [facility])    Prostate    Past Surgical History:  Procedure Laterality Date  . BIOPSY  12/23/2017   Procedure: BIOPSY;  Surgeon: Gatha Mayer, MD;  [facility];  Service: Endoscopy;;  . COLONOSCOPY  2013  . COLONOSCOPY WITH PROPOFOL N/A 12/23/2017   Procedure: COLONOSCOPY WITH PROPOFOL;  Surgeon: Gatha Mayer, MD;  [facility];  Service: Endoscopy;  Laterality: N/A;  . CYSTOSCOPY W/ URETERAL STENT PLACEMENT Right 09/16/2018   Procedure: CYSTOSCOPY WITH RETROGRADE PYELOGRAM/URETERAL STENT PLACEMENT;  Surgeon: Ceasar Mons, MD;  [facility];  Service: Urology;  Laterality: Right;  . EXTRACORPOREAL SHOCK WAVE LITHOTRIPSY Right 10/03/2018   Procedure: EXTRACORPOREAL SHOCK WAVE LITHOTRIPSY (ESWL);   Surgeon: Raynelle Bring, MD;  [facility];  Service: Urology;  Laterality: Right;  . LITHOTRIPSY    . LYMPHADENECTOMY Bilateral 12/06/2017   Procedure: LYMPHADENECTOMY, PELVIC;  Surgeon: Raynelle Bring, MD;  [facility];  Service: Urology;  Laterality: Bilateral;  . PROSTATE BIOPSY N/A 09/28/2017   Procedure: BIOPSY TRANSRECTAL ULTRASONIC PROSTATE (TUBP);  Surgeon: Irine Seal, MD;  [facility];  Service: Urology;  Laterality: N/A;  . PROSTATECTOMY    . ROBOT ASSISTED LAPAROSCOPIC RADICAL PROSTATECTOMY N/A 12/06/2017   Procedure: XI ROBOTIC ASSISTED LAPAROSCOPIC RADICAL PROSTATECTOMY LEVEL 2;  Surgeon: Raynelle Bring, MD;  [facility];  Service: Urology;  Laterality: N/A;  . TOOTH EXTRACTION  1999    Family History  Problem Relation Age of Onset  . Healthy Sister   . Heart disease Mother   . Alzheimer's disease Mother   . Other Father        MVA  . Heart disease Brother   . Heart attack Brother        age 84  . Diabetes Maternal Uncle   . Diabetes Paternal Aunt   . Healthy Brother   . Colon cancer Maternal Aunt   . Diabetes Mellitus II Paternal Uncle    Social History:  reports that he has never smoked. He has never used smokeless tobacco. He reports that he does not drink alcohol and does not use drugs.  Allergies:  Allergies  Allergen Reactions  . Morphine And Related Nausea Only    Medications Prior to Admission  Medication  Sig Dispense Refill  . aspirin EC 81 MG tablet Take 81 mg by mouth daily.    Marland Kitchen atorvastatin (LIPITOR) 10 MG tablet Take 1 tablet (10 mg total) by mouth daily.    . Calcium Citrate (CITRACAL PO) Take 1 tablet by mouth daily.    . cholecalciferol (VITAMIN D3) 25 MCG (1000 UNIT) tablet Take 1,000 Units by mouth daily.    Marland Kitchen loperamide (IMODIUM) 2 MG capsule Take 1 capsule (2 mg total) by mouth daily as needed for diarrhea or loose stools.    . mercaptopurine (PURINETHOL) 50 MG tablet Take 1.5 tablets (75 mg total) by mouth daily. Give on an  empty stomach 1 hour before or 2 hours after meals. Caution: Chemotherapy. 150 tablet 1  . metFORMIN (GLUCOPHAGE) 500 MG tablet Take 500 mg by mouth daily with breakfast.    . Multiple Vitamin (MULTI VITAMIN MENS PO) Take 1 tablet by mouth daily.      Results for orders placed or performed during the hospital encounter of 04/21/20 (from the past 48 hour(s))  Glucose, capillary     Status: Abnormal   Collection Time: 04/21/20  8:20 AM  Result Value Ref Range   Glucose-Capillary 137 (H) 70 - 99 mg/dL    Comment: Glucose reference range applies only to samples taken after fasting for at least 8 hours.   No results found.  Review of Systems  Constitutional: Negative.   HENT: Negative.   Eyes: Negative.   Respiratory: Negative.   Cardiovascular: Negative.   Gastrointestinal: Positive for blood in stool and diarrhea.  Endocrine: Negative.   Genitourinary: Negative.   Musculoskeletal: Negative.   Skin: Negative.   Allergic/Immunologic: Negative.   Hematological: Negative.   Psychiatric/Behavioral: Negative.     Blood pressure 137/69, pulse 82, temperature 98 F (36.7 C), temperature source Oral, resp. rate 20, SpO2 98 %. Physical Exam  GENERAL: The patient is AO x3, in no acute distress. HEENT: Head is normocephalic and atraumatic. EOMI are intact. Mouth is well hydrated and without lesions. NECK: Supple. No masses LUNGS: Clear to auscultation. No presence of rhonchi/wheezing/rales. Adequate chest expansion HEART: RRR, normal s1 and s2. ABDOMEN: Soft, nontender, no guarding, no peritoneal signs, and nondistended. BS +. No masses. EXTREMITIES: Without any cyanosis, clubbing, rash, lesions or edema. NEUROLOGIC: AOx3, no focal motor deficit. SKIN: no jaundice, no rashes  Assessment/Plan Brett Trevino is a 65 y.o. male with PMh prostate cancer s/p surgery, DM, HTN HLD, GERD, and Crohn's disease of the small bowel, who presents for endoscopic evaluation  of Crohn's ileitis. Will  proceed with colonoscopy.  Harvel Quale, MD 04/21/2020, 9:34 AM

## 2020-04-21 NOTE — Anesthesia Postprocedure Evaluation (Signed)
Anesthesia Post Note  Patient: Brett Trevino  Procedure(s) Performed: COLONOSCOPY WITH PROPOFOL (N/A )  Patient location during evaluation: PACU Anesthesia Type: General Level of consciousness: awake, oriented, awake and alert and patient cooperative Pain management: pain level controlled Vital Signs Assessment: post-procedure vital signs reviewed and stable Respiratory status: spontaneous breathing, respiratory function stable and nonlabored ventilation Cardiovascular status: stable Postop Assessment: no apparent nausea or vomiting Anesthetic complications: no   No complications documented.   Last Vitals:  Vitals:   04/21/20 0808  BP: 137/69  Pulse: 82  Resp: 20  Temp: 36.7 C  SpO2: 98%    Last Pain:  Vitals:   04/21/20 0937  TempSrc:   PainSc: 0-No pain                 [name]

## 2020-04-21 NOTE — Anesthesia Preprocedure Evaluation (Addendum)
Anesthesia Evaluation  Patient identified by MRN, date of birth, ID band Patient awake    Reviewed: Allergy & Precautions, NPO status , Patient's Chart, lab work & pertinent test results  History of Anesthesia Complications (+) PROLONGED EMERGENCE and history of anesthetic complications  Airway Mallampati: II  TM Distance: >3 FB Neck ROM: Full    Dental  (+) Dental Advisory Given   Pulmonary sleep apnea (mild to moderate) and Continuous Positive Airway Pressure Ventilation ,    Pulmonary exam normal breath sounds clear to auscultation       Cardiovascular Exercise Tolerance: Good Normal cardiovascular exam Rhythm:Regular Rate:Normal     Neuro/Psych negative neurological ROS  negative psych ROS   GI/Hepatic Neg liver ROS, GERD (very rarely)  ,  Endo/Other  diabetes, Well Controlled, Type 2, Oral Hypoglycemic Agents  Renal/GU Renal InsufficiencyRenal disease   Prostate cancer     Musculoskeletal negative musculoskeletal ROS (+)   Abdominal   Peds  Hematology negative hematology ROS (+)   Anesthesia Other Findings   Reproductive/Obstetrics negative OB ROS                            Anesthesia Physical Anesthesia Plan  ASA: II  Anesthesia Plan: General   Post-op Pain Management:    Induction: Intravenous  PONV Risk Score and Plan: TIVA and Propofol infusion  Airway Management Planned: Nasal Cannula and Natural Airway  Additional Equipment:   Intra-op Plan:   Post-operative Plan:   Informed Consent: I have reviewed the patients History and Physical, chart, labs and discussed the procedure including the risks, benefits and alternatives for the proposed anesthesia with the patient or authorized representative who has indicated his/her understanding and acceptance.     Dental advisory given  Plan Discussed with: CRNA and Surgeon  Anesthesia Plan Comments:          Anesthesia Quick Evaluation

## 2020-04-21 NOTE — Op Note (Signed)
[facility] Patient Name: Brett Trevino Procedure Date: 04/21/2020 9:36 AM MRN: [id number] Date of Birth: Oct 03, 1955 Attending MD: [name] ,  CSN: [id number] Age: 65 Admit Type: Outpatient Procedure:                Colonoscopy Indications:              Elevated calprotectin Providers:                [name], Crystal Page, Randa Spike,                            Technician Referring MD:              Medicines:                Monitored Anesthesia Care Complications:            No immediate complications. Estimated Blood Loss:     Estimated blood loss: none. Procedure:                Pre-Anesthesia Assessment:                           - Prior to the procedure, a History and Physical                            was performed, and patient medications, allergies                            and sensitivities were reviewed. The patient's                            tolerance of previous anesthesia was reviewed.                           - The risks and benefits of the procedure and the                            sedation options and risks were discussed with the                            patient. All questions were answered and informed                            consent was obtained.                           - ASA Grade Assessment: II - A patient with mild                            systemic disease.                           After obtaining informed consent, the colonoscope                            was passed under direct vision. Throughout the  procedure, the patient's blood pressure, pulse, and                            oxygen saturations were monitored continuously. The                            [id number]-HQ190L ([id number]) scope was introduced through                            the anus and advanced to the 10 cm into the ileum.                            The colonoscopy was performed without difficulty.                            The  patient tolerated the procedure well. The                            quality of the bowel preparation was excellent.                            Scope withdrawal time was 12 minutes. Scope In: 9:45:17 AM Scope Out: 10:10:27 AM Scope Withdrawal Time: 0 hours 18 minutes 36 seconds  Total Procedure Duration: 0 hours 25 minutes 10 seconds  Findings:      The perianal and digital rectal examinations were normal.      The terminal ileum appeared normal. There was complete healing of the       previous area of congestion and erythema found previously. It appears in       endoscopic remission.      A few small-mouthed diverticula were found in the sigmoid colon.      Non-bleeding internal hemorrhoids were found during retroflexion. The       hemorrhoids were small. Impression:               - The examined portion of the ileum was normal.                           - Diverticulosis in the sigmoid colon.                           - Non-bleeding internal hemorrhoids.                           - No specimens collected. Moderate Sedation:      Per Anesthesia Care Recommendation:           - Discharge patient to home (ambulatory).                           - Resume previous diet.                           - Continue present medications.                           - Repeat colonoscopy in 10  years for screening                            purposes. Procedure Code(s):        --- Professional ---                           [phone], Colonoscopy, flexible; diagnostic, including                            collection of specimen(s) by brushing or washing,                            when performed (separate procedure) Diagnosis Code(s):        --- Professional ---                           K64.8, Other hemorrhoids                           K57.30, Diverticulosis of large intestine without                            perforation or abscess without bleeding CPT copyright 2019 American Medical Association. All  rights reserved. The codes documented in this report are preliminary and upon coder review may  be revised to meet current compliance requirements. [name], MD [name],  04/21/2020 10:18:28 AM This report has been signed electronically. Number of Addenda: 0

## 2020-04-26 ENCOUNTER — Encounter (HOSPITAL_COMMUNITY): Payer: Self-pay | Admitting: Gastroenterology

## 2020-06-08 LAB — COMPREHENSIVE METABOLIC PANEL
AG Ratio: 1.5 (calc) (ref 1.0–2.5)
ALT: 22 U/L (ref 9–46)
AST: 24 U/L (ref 10–35)
Albumin: 3.9 g/dL (ref 3.6–5.1)
Alkaline phosphatase (APISO): 53 U/L (ref 35–144)
BUN: 11 mg/dL (ref 7–25)
CO2: 30 mmol/L (ref 20–32)
Calcium: 9.2 mg/dL (ref 8.6–10.3)
Chloride: 105 mmol/L (ref 98–110)
Creat: 1 mg/dL (ref 0.70–1.25)
Globulin: 2.6 g/dL (calc) (ref 1.9–3.7)
Glucose, Bld: 150 mg/dL — ABNORMAL HIGH (ref 65–139)
Potassium: 3.8 mmol/L (ref 3.5–5.3)
Sodium: 142 mmol/L (ref 135–146)
Total Bilirubin: 0.7 mg/dL (ref 0.2–1.2)
Total Protein: 6.5 g/dL (ref 6.1–8.1)

## 2020-06-08 LAB — CBC WITH DIFFERENTIAL/PLATELET
Absolute Monocytes: 510 cells/uL (ref 200–950)
Basophils Absolute: 48 cells/uL (ref 0–200)
Basophils Relative: 0.7 %
Eosinophils Absolute: 61 cells/uL (ref 15–500)
Eosinophils Relative: 0.9 %
HCT: 41.2 % (ref 38.5–50.0)
Hemoglobin: 14.2 g/dL (ref 13.2–17.1)
Lymphs Abs: 666 cells/uL — ABNORMAL LOW (ref 850–3900)
MCH: 31.3 pg (ref 27.0–33.0)
MCHC: 34.5 g/dL (ref 32.0–36.0)
MCV: 90.7 fL (ref 80.0–100.0)
MPV: 10.4 fL (ref 7.5–12.5)
Monocytes Relative: 7.5 %
Neutro Abs: 5515 cells/uL (ref 1500–7800)
Neutrophils Relative %: 81.1 %
Platelets: 232 10*3/uL (ref 140–400)
RBC: 4.54 10*6/uL (ref 4.20–5.80)
RDW: 14.4 % (ref 11.0–15.0)
Total Lymphocyte: 9.8 %
WBC: 6.8 10*3/uL (ref 3.8–10.8)

## 2020-07-22 DIAGNOSIS — K5000 Crohn's disease of small intestine without complications: Secondary | ICD-10-CM | POA: Diagnosis not present

## 2020-07-22 DIAGNOSIS — Z0001 Encounter for general adult medical examination with abnormal findings: Secondary | ICD-10-CM | POA: Diagnosis not present

## 2020-07-22 DIAGNOSIS — E1169 Type 2 diabetes mellitus with other specified complication: Secondary | ICD-10-CM | POA: Diagnosis not present

## 2020-08-09 ENCOUNTER — Ambulatory Visit (INDEPENDENT_AMBULATORY_CARE_PROVIDER_SITE_OTHER): Payer: Medicare Other | Admitting: Dermatology

## 2020-08-09 ENCOUNTER — Other Ambulatory Visit: Payer: Self-pay

## 2020-08-09 DIAGNOSIS — K50919 Crohn's disease, unspecified, with unspecified complications: Secondary | ICD-10-CM | POA: Diagnosis not present

## 2020-08-09 DIAGNOSIS — L738 Other specified follicular disorders: Secondary | ICD-10-CM | POA: Diagnosis not present

## 2020-08-09 DIAGNOSIS — Z1283 Encounter for screening for malignant neoplasm of skin: Secondary | ICD-10-CM

## 2020-08-21 ENCOUNTER — Encounter: Payer: Self-pay | Admitting: Dermatology

## 2020-08-21 NOTE — Progress Notes (Signed)
   New Patient   Subjective  Brett Trevino is a 64 y.o. male who presents for the following: Annual Exam (Patients gastro dr referred him here and said he needed skin exam because he has Crohns disease. /No history of skin cancer. ).  General skin examination Location:  Duration:  Quality:  Associated Signs/Symptoms: Modifying Factors:  Severity:  Timing: Context:    The following portions of the chart were reviewed this encounter and updated as appropriate:  Tobacco  Allergies  Meds  Problems  Med Hx  Surg Hx  Fam Hx      Objective  Well appearing patient in no apparent distress; mood and affect are within normal limits. Objective  Mid Back: Full body skin exam.  No atypical pigmented lesions or nonmelanoma skin cancer.  Discussed risks of skin cancer both directly related to the Crohn's disease itself as well as related to several treatments.  Objective  Head - Anterior (Face): Dozen two millimeter eccentrically delled cream-colored dermal papules.  Typical dermoscopy.    A full examination was performed including scalp, head, eyes, ears, nose, lips, neck, chest, axillae, abdomen, back, buttocks, bilateral upper extremities, bilateral lower extremities, hands, feet, fingers, toes, fingernails, and toenails. All findings within normal limits unless otherwise noted below.   Assessment & Plan  Screening for malignant neoplasm of skin Mid Back  Annual or biannual general skin examination.  To self examine twice annually.  Continued ultraviolet protection.  Sebaceous hyperplasia of face Head - Anterior (Face)  Told that these may resemble early basal cell carcinoma so if any grow or bleed he should return.

## 2020-09-06 ENCOUNTER — Other Ambulatory Visit: Payer: Self-pay

## 2020-09-06 ENCOUNTER — Ambulatory Visit (INDEPENDENT_AMBULATORY_CARE_PROVIDER_SITE_OTHER): Payer: Medicare Other | Admitting: Gastroenterology

## 2020-09-06 ENCOUNTER — Encounter (INDEPENDENT_AMBULATORY_CARE_PROVIDER_SITE_OTHER): Payer: Self-pay | Admitting: Gastroenterology

## 2020-09-06 VITALS — BP 124/78 | HR 97 | Temp 98.2°F | Ht 67.0 in | Wt 247.0 lb

## 2020-09-06 DIAGNOSIS — K5000 Crohn's disease of small intestine without complications: Secondary | ICD-10-CM | POA: Diagnosis not present

## 2020-09-06 NOTE — Progress Notes (Signed)
Brett Trevino, M.D. Gastroenterology & Hepatology [facility] [address] [place], [place] [address]  Primary Care Physician: Curlene Labrum, MD [place] [id number]  I will communicate my assessment and recommendations to the referring MD via EMR.  Problems: 1. Crohn's ileitis  History of Present Illness: Brett Trevino is a 65 y.o. male with PMh prostate cancer s/p surgery, DM, HTN HLD, GERD, and Crohn's disease of the small bowel, who presents for follow up of Crohn's ileitis.  The patient was last seen on 03/08/2020. At that time, the patient had blood work-up performed that showed normal CBC, CMP, B12, vitamin D and CRP but his fecal calprotectin was elevated.  He continues he underwent a colonoscopy on 04/21/2020 that showed normal terminal ileum up to 10 cm from the ileocecal valve, diverticulosis and internal hemorrhoids.  Patient reports that in February 2022 he had a "flare" of his disease as he had some nausea and vomiting, as well as abdominal pain. This lasted for a day and resolved on its own. Did not require medications or hospitalization.  He did not inform our clinic about this episode.  The patient reported that he was not concerned as this was milder compared to previous episodes he had in the past.  Since then he has felt well but endorses having occasional episodes of discomfort in his abdomen, which are infrequent, but no abdominal pain.  Currently takes 1.5 tablets of 6-MP daily.  Has never had a capsule endoscopy in the past.  The patient denies having any nausea, vomiting, fever, chills, hematochezia, melena, hematemesis, diarrhea, jaundice, pruritus or weight loss.  Last Colonoscopy: As above  Last flu shot: 2021 Last pneumonia shot:2  Years ago possibly Last evaluation by dermatology:  2022 - shceudled for yearly appointment Last zoster vaccine never, not covered by insurance COVID-19 shot:  Moderna x2  Past Medical History: Past Medical History:  Diagnosis Date  . Complication of anesthesia    difficulty waking up after anesthesia  . Crohn disease ([place]) 08/28/2017  . Diabetes ([place])    type 2  . GERD (gastroesophageal reflux disease)   . H/O vitamin D deficiency   . High cholesterol   . History of kidney stones   . History of kidney stones   . Prostate cancer [facility])    Prostate    Past Surgical History: Past Surgical History:  Procedure Laterality Date  . BIOPSY  12/23/2017   Procedure: BIOPSY;  Surgeon: Gatha Mayer, MD;  [facility];  Service: Endoscopy;;  . COLONOSCOPY  2013  . COLONOSCOPY WITH PROPOFOL N/A 12/23/2017   Procedure: COLONOSCOPY WITH PROPOFOL;  Surgeon: Gatha Mayer, MD;  [facility];  Service: Endoscopy;  Laterality: N/A;  . COLONOSCOPY WITH PROPOFOL N/A 04/21/2020   Procedure: COLONOSCOPY WITH PROPOFOL;  Surgeon: Harvel Quale, MD;  [facility];  Service: Gastroenterology;  Laterality: N/A;  9:30  . CYSTOSCOPY W/ URETERAL STENT PLACEMENT Right 09/16/2018   Procedure: CYSTOSCOPY WITH RETROGRADE PYELOGRAM/URETERAL STENT PLACEMENT;  Surgeon: Ceasar Mons, MD;  [facility];  Service: Urology;  Laterality: Right;  . EXTRACORPOREAL SHOCK WAVE LITHOTRIPSY Right 10/03/2018   Procedure: EXTRACORPOREAL SHOCK WAVE LITHOTRIPSY (ESWL);  Surgeon: Raynelle Bring, MD;  [facility];  Service: Urology;  Laterality: Right;  . LITHOTRIPSY    . LYMPHADENECTOMY Bilateral 12/06/2017   Procedure: LYMPHADENECTOMY, PELVIC;  Surgeon: Raynelle Bring, MD;  [facility];  Service: Urology;  Laterality: Bilateral;  . PROSTATE BIOPSY N/A 09/28/2017   Procedure: BIOPSY TRANSRECTAL ULTRASONIC PROSTATE (TUBP);  Surgeon: Irine Seal, MD;  [facility];  Service: Urology;  Laterality: N/A;  . PROSTATECTOMY    . ROBOT ASSISTED LAPAROSCOPIC RADICAL PROSTATECTOMY N/A 12/06/2017   Procedure: XI ROBOTIC ASSISTED  LAPAROSCOPIC RADICAL PROSTATECTOMY LEVEL 2;  Surgeon: Raynelle Bring, MD;  [facility];  Service: Urology;  Laterality: N/A;  . TOOTH EXTRACTION  1999    Family History: Family History  Problem Relation Age of Onset  . Healthy Sister   . Heart disease Mother   . Alzheimer's disease Mother   . Other Father        MVA  . Heart disease Brother   . Heart attack Brother        age 50  . Diabetes Maternal Uncle   . Diabetes Paternal Aunt   . Healthy Brother   . Colon cancer Maternal Aunt   . Diabetes Mellitus II Paternal Uncle     Social History: Social History   Tobacco Use  Smoking Status Never Smoker  Smokeless Tobacco Never Used   Social History   Substance and Sexual Activity  Alcohol Use Never   Social History   Substance and Sexual Activity  Drug Use Never    Allergies: Allergies  Allergen Reactions  . Morphine And Related Nausea Only    Medications: Current Outpatient Medications  Medication Sig Dispense Refill  . aspirin EC 81 MG tablet Take 81 mg by mouth daily.    Marland Kitchen atorvastatin (LIPITOR) 10 MG tablet Take 1 tablet (10 mg total) by mouth daily.    . Calcium Citrate (CITRACAL PO) Take 1 tablet by mouth daily.    . cholecalciferol (VITAMIN D3) 25 MCG (1000 UNIT) tablet Take 1,000 Units by mouth daily.    Marland Kitchen loperamide (IMODIUM) 2 MG capsule Take 1 capsule (2 mg total) by mouth daily as needed for diarrhea or loose stools.    . mercaptopurine (PURINETHOL) 50 MG tablet Take 1.5 tablets (75 mg total) by mouth daily. Give on an empty stomach 1 hour before or 2 hours after meals. Caution: Chemotherapy. 150 tablet 1  . metFORMIN (GLUCOPHAGE) 500 MG tablet Take 500 mg by mouth daily with breakfast.    . Multiple Vitamin (MULTI VITAMIN MENS PO) Take 1 tablet by mouth daily.     No current facility-administered medications for this visit.    Review of Systems: GENERAL: negative for malaise, night sweats HEENT: No changes in hearing or vision, no nose  bleeds or other nasal problems. NECK: Negative for lumps, goiter, pain and significant neck swelling RESPIRATORY: Negative for cough, wheezing CARDIOVASCULAR: Negative for chest pain, leg swelling, palpitations, orthopnea GI: SEE HPI MUSCULOSKELETAL: Negative for joint pain or swelling, back pain, and muscle pain. SKIN: Negative for lesions, rash PSYCH: Negative for sleep disturbance, mood disorder and recent psychosocial stressors. HEMATOLOGY Negative for prolonged bleeding, bruising easily, and swollen nodes. ENDOCRINE: Negative for cold or heat intolerance, polyuria, polydipsia and goiter. NEURO: negative for tremor, gait imbalance, syncope and seizures. The remainder of the review of systems is noncontributory.   Physical Exam: BP 124/78 (BP Location: Right Arm, Patient Position: Sitting, Cuff Size: Large)   Pulse 97   Temp 98.2 F (36.8 C) (Oral)   Ht 5\' 7"  (1.702 m)   Wt 247 lb (112 kg)   BMI 38.69 kg/m  GENERAL: The patient is AO x3, in no acute distress. HEENT: Head is normocephalic and atraumatic. EOMI  are intact. Mouth is well hydrated and without lesions. NECK: Supple. No masses LUNGS: Clear to auscultation. No presence of rhonchi/wheezing/rales. Adequate chest expansion HEART: RRR, normal s1 and s2. ABDOMEN: Soft, nontender, no guarding, no peritoneal signs, and nondistended. BS +. No masses. EXTREMITIES: Without any cyanosis, clubbing, rash, lesions or edema. NEUROLOGIC: AOx3, no focal motor deficit. SKIN: no jaundice, no rashes  Imaging/Labs: as above  I personally reviewed and interpreted the available labs, imaging and endoscopic files.  Impression and Plan: Brett Trevino is a 65 y.o. male with PMh prostate cancer s/p surgery, DM, HTN HLD, GERD, and Crohn's disease of the small bowel, who presents for follow up of Crohn's ileitis.  Patient has intermittently 6-MP with very occasional clinical symptoms.  He had an elevated fecal calprotectin at the end 2021  which led to a subsequent colonoscopy that was unremarkable.  I informed the patient that this could happened in the setting of inflammation of the lower segment of the small bowel as he had an episode of abdominal pain.  However, he is not interested in exploring this further at this moment unless his symptoms recur.  I think this is reasonable at this moment, if he were to have recurrent symptoms we will need to explore this further with an MRA and possibly a capsule endoscopy to determine if there is endoscopic remission of his disease.  For now, he will continue on the same doses of 6-MP.  He is not due for repeat labs as he had some performed in March 2022.  -Continue 6-MP 75 mg qday - Patient to call back if he develops any new symptoms of pain/discomfort, will need to proceed with repeat MRE - RTC 6 months  All questions were answered.      Harvel Quale, MD Gastroenterology and Hepatology [facility]

## 2020-09-06 NOTE — Patient Instructions (Signed)
Continue 6-MP 75 mg qday Please call if you develop any new symptoms of pain/discomfort, will need to proceed with repeat MRE

## 2020-11-06 ENCOUNTER — Other Ambulatory Visit (INDEPENDENT_AMBULATORY_CARE_PROVIDER_SITE_OTHER): Payer: Self-pay | Admitting: Gastroenterology

## 2020-11-06 DIAGNOSIS — K5000 Crohn's disease of small intestine without complications: Secondary | ICD-10-CM

## 2020-11-08 NOTE — Telephone Encounter (Signed)
Last ov 09/06/20 for crohn's ileitis and next visit 12/8

## 2021-03-10 ENCOUNTER — Other Ambulatory Visit: Payer: Self-pay

## 2021-03-10 ENCOUNTER — Ambulatory Visit (INDEPENDENT_AMBULATORY_CARE_PROVIDER_SITE_OTHER): Payer: Medicare Other | Admitting: Gastroenterology

## 2021-03-10 ENCOUNTER — Encounter (INDEPENDENT_AMBULATORY_CARE_PROVIDER_SITE_OTHER): Payer: Self-pay | Admitting: Gastroenterology

## 2021-03-10 VITALS — BP 130/77 | HR 69 | Temp 98.1°F | Ht 67.0 in | Wt 249.2 lb

## 2021-03-10 DIAGNOSIS — Z1321 Encounter for screening for nutritional disorder: Secondary | ICD-10-CM

## 2021-03-10 DIAGNOSIS — E559 Vitamin D deficiency, unspecified: Secondary | ICD-10-CM | POA: Diagnosis not present

## 2021-03-10 DIAGNOSIS — K5000 Crohn's disease of small intestine without complications: Secondary | ICD-10-CM

## 2021-03-10 MED ORDER — MERCAPTOPURINE 50 MG PO TABS: 75.0000 mg | ORAL_TABLET | Freq: Every day | ORAL | 1 refills | Status: DC

## 2021-03-10 NOTE — Progress Notes (Signed)
Brett Trevino, M.D. Gastroenterology & Hepatology [facility] [address] [place], [place] [address]  Primary Care Physician: Curlene Labrum, MD [place] [id number]  I will communicate my assessment and recommendations to the referring MD via EMR.  Problems: Crohn's ileitis in remission  History of Present Illness: Brett Trevino is a 65 y.o. male with PMh prostate cancer s/p surgery, DM, HTN HLD, GERD, and Crohn's disease of the small bowel, who presents for follow up of Crohn's ileitis.  The patient was last seen on 09/06/2020. At that time, the patient was doing well clinically and was continued on 6-MP 75 mg every day.  Patient reported he has felt well and denies having any complaints.  Has not presented any more episodes of nausea or vomiting for the last 6 months.  Has 1-2 loose bowel movements per day. Has presented some scant amount of blood intermitenly which he believes is secondary to having some loose Bms. Sometimes there can be some pain in the rectal area.  The patient denies having any nausea, vomiting, fever, chills, hematochezia, melena, hematemesis, abdominal distention, abdominal pain, jaundice, pruritus or weight loss.  Last flu shot: 2022 Last pneumonia shot: 3 Years ago possibly Last evaluation by dermatology: saw them in 2022 Last zoster vaccine never, not covered by insurance COVID-19 shot: Moderna x2   The patient denies having any nausea, vomiting, fever, chills, hematochezia, melena, hematemesis, abdominal distention, abdominal pain, diarrhea, jaundice, pruritus or weight loss.  Last Colonoscopy: 04/21/2020 that showed normal terminal ileum up to 10 cm from the ileocecal valve, diverticulosis and internal hemorrhoids.  Past Medical History: Past Medical History:  Diagnosis Date   Complication of anesthesia    difficulty waking up after anesthesia   Crohn disease ([place]) 08/28/2017    Diabetes ([place])    type 2   GERD (gastroesophageal reflux disease)    H/O vitamin D deficiency    High cholesterol    History of kidney stones    History of kidney stones    Prostate cancer [facility])    Prostate    Past Surgical History: Past Surgical History:  Procedure Laterality Date   BIOPSY  12/23/2017   Procedure: BIOPSY;  Surgeon: Gatha Mayer, MD;  [facility];  Service: Endoscopy;;   COLONOSCOPY  2013   COLONOSCOPY WITH PROPOFOL N/A 12/23/2017   Procedure: COLONOSCOPY WITH PROPOFOL;  Surgeon: Gatha Mayer, MD;  [facility];  Service: Endoscopy;  Laterality: N/A;   COLONOSCOPY WITH PROPOFOL N/A 04/21/2020   Procedure: COLONOSCOPY WITH PROPOFOL;  Surgeon: Harvel Quale, MD;  [facility];  Service: Gastroenterology;  Laterality: N/A;  9:30   CYSTOSCOPY W/ URETERAL STENT PLACEMENT Right 09/16/2018   Procedure: CYSTOSCOPY WITH RETROGRADE PYELOGRAM/URETERAL STENT PLACEMENT;  Surgeon: Ceasar Mons, MD;  [facility];  Service: Urology;  Laterality: Right;   EXTRACORPOREAL SHOCK WAVE LITHOTRIPSY Right 10/03/2018   Procedure: EXTRACORPOREAL SHOCK WAVE LITHOTRIPSY (ESWL);  Surgeon: Raynelle Bring, MD;  [facility];  Service: Urology;  Laterality: Right;   LITHOTRIPSY     LYMPHADENECTOMY Bilateral 12/06/2017   Procedure: LYMPHADENECTOMY, PELVIC;  Surgeon: Raynelle Bring, MD;  [facility];  Service: Urology;  Laterality: Bilateral;   PROSTATE BIOPSY N/A 09/28/2017   Procedure: BIOPSY TRANSRECTAL ULTRASONIC PROSTATE (TUBP);  Surgeon: Irine Seal, MD;  [facility];  Service: Urology;  Laterality: N/A;   PROSTATECTOMY     ROBOT ASSISTED LAPAROSCOPIC RADICAL PROSTATECTOMY N/A  12/06/2017   Procedure: XI ROBOTIC ASSISTED LAPAROSCOPIC RADICAL PROSTATECTOMY LEVEL 2;  Surgeon: Raynelle Bring, MD;  [facility];  Service: Urology;  Laterality: N/A;   TOOTH EXTRACTION  1999    Family History: Family History  Problem  Relation Age of Onset   Healthy Sister    Heart disease Mother    Alzheimer's disease Mother    Other Father        MVA   Heart disease Brother    Heart attack Brother        age 82   Diabetes Maternal Uncle    Diabetes Paternal Aunt    Healthy Brother    Colon cancer Maternal Aunt    Diabetes Mellitus II Paternal Uncle     Social History: Social History   Tobacco Use  Smoking Status Never  Smokeless Tobacco Never   Social History   Substance and Sexual Activity  Alcohol Use Never   Social History   Substance and Sexual Activity  Drug Use Never    Allergies: Allergies  Allergen Reactions   Morphine And Related Nausea Only    Medications: Current Outpatient Medications  Medication Sig Dispense Refill   aspirin EC 81 MG tablet Take 81 mg by mouth daily.     atorvastatin (LIPITOR) 10 MG tablet Take 1 tablet (10 mg total) by mouth daily.     Calcium Citrate (CITRACAL PO) Take 1 tablet by mouth daily.     cholecalciferol (VITAMIN D3) 25 MCG (1000 UNIT) tablet Take 1,000 Units by mouth daily.     loperamide (IMODIUM) 2 MG capsule Take 1 capsule (2 mg total) by mouth daily as needed for diarrhea or loose stools.     mercaptopurine (PURINETHOL) 50 MG tablet Take 1.5 tablets (75 mg total) by mouth daily. 135 tablet 1   metFORMIN (GLUCOPHAGE) 500 MG tablet Take 500 mg by mouth daily with breakfast.     Multiple Vitamin (MULTI VITAMIN MENS PO) Take 1 tablet by mouth daily.     No current facility-administered medications for this visit.    Review of Systems: GENERAL: negative for malaise, night sweats HEENT: No changes in hearing or vision, no nose bleeds or other nasal problems. NECK: Negative for lumps, goiter, pain and significant neck swelling RESPIRATORY: Negative for cough, wheezing CARDIOVASCULAR: Negative for chest pain, leg swelling, palpitations, orthopnea GI: SEE HPI MUSCULOSKELETAL: Negative for joint pain or swelling, back pain, and muscle pain. SKIN:  Negative for lesions, rash PSYCH: Negative for sleep disturbance, mood disorder and recent psychosocial stressors. HEMATOLOGY Negative for prolonged bleeding, bruising easily, and swollen nodes. ENDOCRINE: Negative for cold or heat intolerance, polyuria, polydipsia and goiter. NEURO: negative for tremor, gait imbalance, syncope and seizures. The remainder of the review of systems is noncontributory.   Physical Exam: BP 130/77 (BP Location: Left Arm, Patient Position: Sitting, Cuff Size: Large)   Pulse 69   Temp 98.1 F (36.7 C) (Oral)   Ht 5\' 7"  (1.702 m)   Wt 249 lb 3.2 oz (113 kg)   BMI 39.03 kg/m  GENERAL: The patient is AO x3, in no acute distress. Obese. HEENT: Head is normocephalic and atraumatic. EOMI are intact. Mouth is well hydrated and without lesions. NECK: Supple. No masses LUNGS: Clear to auscultation. No presence of rhonchi/wheezing/rales. Adequate chest expansion HEART: RRR, normal s1 and s2. ABDOMEN: Soft, nontender, no guarding, no peritoneal signs, and nondistended. BS +. No masses. EXTREMITIES: Without any cyanosis, clubbing, rash, lesions or edema. NEUROLOGIC: AOx3, no focal motor  deficit. SKIN: no jaundice, no rashes  Imaging/Labs: as above  I personally reviewed and interpreted the available labs, imaging and endoscopic files.  Impression and Plan: Brett Trevino is a 65 y.o. male with PMh prostate cancer s/p surgery, DM, HTN HLD, GERD, and Crohn's disease of the small bowel, who presents for follow up of Crohn's ileitis.  The patient states that he has been doing well clinically and even though he has some loose stool he seems to be in clinical remission.  Given the fact that he had a normal colonoscopy and no presence of any inflammatory findings in an MRI performed last year, I will not proceed with any further investigations.  We will check surveillance labs today.  As he had elevation of his liver enzymes in the past and did not tolerate a higher dose of  the 6-MP, we will continue at the current dose which I consider is adequate.  Patient is almost up-to-date regarding his vaccinations but will ask his PCP to obtain the zoster vaccine which he should be eligible given his age and immunosuppression status.  - Continue 6-MP 75 mg qday - Check CBC, CMP, CRP, P32, folic acid and vitamin D - RTC 6 months  All questions were answered.      Harvel Quale, MD Gastroenterology and Hepatology [facility]

## 2021-03-10 NOTE — Patient Instructions (Addendum)
Continue 6-MP 75 mg qday Perform blood workup

## 2021-03-11 LAB — COMPREHENSIVE METABOLIC PANEL
AG Ratio: 1.3 (calc) (ref 1.0–2.5)
ALT: 30 U/L (ref 9–46)
AST: 30 U/L (ref 10–35)
Albumin: 3.9 g/dL (ref 3.6–5.1)
Alkaline phosphatase (APISO): 50 U/L (ref 35–144)
BUN: 9 mg/dL (ref 7–25)
CO2: 29 mmol/L (ref 20–32)
Calcium: 9.5 mg/dL (ref 8.6–10.3)
Chloride: 102 mmol/L (ref 98–110)
Creat: 1.02 mg/dL (ref 0.70–1.35)
Globulin: 2.9 g/dL (calc) (ref 1.9–3.7)
Glucose, Bld: 126 mg/dL (ref 65–139)
Potassium: 4.3 mmol/L (ref 3.5–5.3)
Sodium: 140 mmol/L (ref 135–146)
Total Bilirubin: 0.7 mg/dL (ref 0.2–1.2)
Total Protein: 6.8 g/dL (ref 6.1–8.1)

## 2021-03-11 LAB — B12 AND FOLATE PANEL
Folate: 21.2 ng/mL
Vitamin B-12: 320 pg/mL (ref 200–1100)

## 2021-03-11 LAB — CBC WITH DIFFERENTIAL/PLATELET
Absolute Monocytes: 391 cells/uL (ref 200–950)
Basophils Absolute: 39 cells/uL (ref 0–200)
Basophils Relative: 0.9 %
Eosinophils Absolute: 60 cells/uL (ref 15–500)
Eosinophils Relative: 1.4 %
HCT: 43.9 % (ref 38.5–50.0)
Hemoglobin: 14.9 g/dL (ref 13.2–17.1)
Lymphs Abs: 791 cells/uL — ABNORMAL LOW (ref 850–3900)
MCH: 31.5 pg (ref 27.0–33.0)
MCHC: 33.9 g/dL (ref 32.0–36.0)
MCV: 92.8 fL (ref 80.0–100.0)
MPV: 10.4 fL (ref 7.5–12.5)
Monocytes Relative: 9.1 %
Neutro Abs: 3019 cells/uL (ref 1500–7800)
Neutrophils Relative %: 70.2 %
Platelets: 266 10*3/uL (ref 140–400)
RBC: 4.73 10*6/uL (ref 4.20–5.80)
RDW: 14 % (ref 11.0–15.0)
Total Lymphocyte: 18.4 %
WBC: 4.3 10*3/uL (ref 3.8–10.8)

## 2021-03-11 LAB — C-REACTIVE PROTEIN: CRP: 0.9 mg/L (ref <8.0)

## 2021-03-11 LAB — VITAMIN D 25 HYDROXY (VIT D DEFICIENCY, FRACTURES): Vit D, 25-Hydroxy: 44 ng/mL (ref 30–100)

## 2021-04-15 DIAGNOSIS — E1169 Type 2 diabetes mellitus with other specified complication: Secondary | ICD-10-CM | POA: Diagnosis not present

## 2021-04-15 DIAGNOSIS — M7652 Patellar tendinitis, left knee: Secondary | ICD-10-CM | POA: Diagnosis not present

## 2021-04-15 DIAGNOSIS — Z6838 Body mass index (BMI) 38.0-38.9, adult: Secondary | ICD-10-CM | POA: Diagnosis not present

## 2021-04-15 DIAGNOSIS — M1712 Unilateral primary osteoarthritis, left knee: Secondary | ICD-10-CM | POA: Diagnosis not present

## 2021-07-20 DIAGNOSIS — C61 Malignant neoplasm of prostate: Secondary | ICD-10-CM | POA: Diagnosis not present

## 2021-07-25 DIAGNOSIS — E7800 Pure hypercholesterolemia, unspecified: Secondary | ICD-10-CM | POA: Diagnosis not present

## 2021-07-25 DIAGNOSIS — K760 Fatty (change of) liver, not elsewhere classified: Secondary | ICD-10-CM | POA: Diagnosis not present

## 2021-07-25 DIAGNOSIS — K5000 Crohn's disease of small intestine without complications: Secondary | ICD-10-CM | POA: Diagnosis not present

## 2021-07-25 DIAGNOSIS — E1169 Type 2 diabetes mellitus with other specified complication: Secondary | ICD-10-CM | POA: Diagnosis not present

## 2021-07-27 DIAGNOSIS — C61 Malignant neoplasm of prostate: Secondary | ICD-10-CM | POA: Diagnosis not present

## 2021-07-27 DIAGNOSIS — N393 Stress incontinence (female) (male): Secondary | ICD-10-CM | POA: Diagnosis not present

## 2021-07-27 DIAGNOSIS — Z87442 Personal history of urinary calculi: Secondary | ICD-10-CM | POA: Diagnosis not present

## 2021-08-09 ENCOUNTER — Ambulatory Visit: Payer: Medicare Other | Admitting: Dermatology

## 2021-08-09 DIAGNOSIS — Z0001 Encounter for general adult medical examination with abnormal findings: Secondary | ICD-10-CM | POA: Diagnosis not present

## 2021-08-09 DIAGNOSIS — E1169 Type 2 diabetes mellitus with other specified complication: Secondary | ICD-10-CM | POA: Diagnosis not present

## 2021-08-09 DIAGNOSIS — C61 Malignant neoplasm of prostate: Secondary | ICD-10-CM | POA: Diagnosis not present

## 2021-08-09 DIAGNOSIS — K5000 Crohn's disease of small intestine without complications: Secondary | ICD-10-CM | POA: Diagnosis not present

## 2021-08-18 ENCOUNTER — Telehealth (INDEPENDENT_AMBULATORY_CARE_PROVIDER_SITE_OTHER): Payer: Self-pay | Admitting: Gastroenterology

## 2021-08-18 NOTE — Telephone Encounter (Signed)
Patient called the office - he has an appointment with Dr Jenetta Downer on 6/8 - he would like to have lab work done before his office visit - please advise - ph# [phone]

## 2021-08-22 ENCOUNTER — Other Ambulatory Visit (INDEPENDENT_AMBULATORY_CARE_PROVIDER_SITE_OTHER): Payer: Self-pay | Admitting: Gastroenterology

## 2021-08-22 DIAGNOSIS — K5000 Crohn's disease of small intestine without complications: Secondary | ICD-10-CM

## 2021-08-22 NOTE — Telephone Encounter (Signed)
Left message with patient spouse asked that she have the patient to return call.

## 2021-08-22 NOTE — Telephone Encounter (Signed)
Thanks, will send the orders

## 2021-08-22 NOTE — Telephone Encounter (Signed)
I spoke w patient he is aware we have mailed the lab orders to him at his address.

## 2021-08-26 DIAGNOSIS — K5000 Crohn's disease of small intestine without complications: Secondary | ICD-10-CM | POA: Diagnosis not present

## 2021-08-27 LAB — C-REACTIVE PROTEIN: CRP: 20.7 mg/L — ABNORMAL HIGH (ref <8.0)

## 2021-08-27 LAB — COMPREHENSIVE METABOLIC PANEL
AG Ratio: 1.5 (calc) (ref 1.0–2.5)
ALT: 24 U/L (ref 9–46)
AST: 25 U/L (ref 10–35)
Albumin: 4 g/dL (ref 3.6–5.1)
Alkaline phosphatase (APISO): 51 U/L (ref 35–144)
BUN: 9 mg/dL (ref 7–25)
CO2: 19 mmol/L — ABNORMAL LOW (ref 20–32)
Calcium: 9.4 mg/dL (ref 8.6–10.3)
Chloride: 104 mmol/L (ref 98–110)
Creat: 1.13 mg/dL (ref 0.70–1.35)
Globulin: 2.7 g/dL (calc) (ref 1.9–3.7)
Glucose, Bld: 180 mg/dL — ABNORMAL HIGH (ref 65–139)
Potassium: 3.8 mmol/L (ref 3.5–5.3)
Sodium: 137 mmol/L (ref 135–146)
Total Bilirubin: 1.2 mg/dL (ref 0.2–1.2)
Total Protein: 6.7 g/dL (ref 6.1–8.1)

## 2021-08-27 LAB — CBC WITH DIFFERENTIAL/PLATELET
Absolute Monocytes: 338 cells/uL (ref 200–950)
Basophils Absolute: 30 cells/uL (ref 0–200)
Basophils Relative: 0.8 %
Eosinophils Absolute: 80 cells/uL (ref 15–500)
Eosinophils Relative: 2.1 %
HCT: 42.7 % (ref 38.5–50.0)
Hemoglobin: 14.9 g/dL (ref 13.2–17.1)
Lymphs Abs: 612 cells/uL — ABNORMAL LOW (ref 850–3900)
MCH: 31.6 pg (ref 27.0–33.0)
MCHC: 34.9 g/dL (ref 32.0–36.0)
MCV: 90.7 fL (ref 80.0–100.0)
MPV: 10.4 fL (ref 7.5–12.5)
Monocytes Relative: 8.9 %
Neutro Abs: 2740 cells/uL (ref 1500–7800)
Neutrophils Relative %: 72.1 %
Platelets: 236 10*3/uL (ref 140–400)
RBC: 4.71 10*6/uL (ref 4.20–5.80)
RDW: 14.4 % (ref 11.0–15.0)
Total Lymphocyte: 16.1 %
WBC: 3.8 10*3/uL (ref 3.8–10.8)

## 2021-09-01 DIAGNOSIS — K5000 Crohn's disease of small intestine without complications: Secondary | ICD-10-CM | POA: Diagnosis not present

## 2021-09-07 LAB — CALPROTECTIN: Calprotectin: 298 mcg/g — ABNORMAL HIGH

## 2021-09-08 ENCOUNTER — Encounter (INDEPENDENT_AMBULATORY_CARE_PROVIDER_SITE_OTHER): Payer: Self-pay | Admitting: Gastroenterology

## 2021-09-08 ENCOUNTER — Ambulatory Visit (INDEPENDENT_AMBULATORY_CARE_PROVIDER_SITE_OTHER): Payer: Medicare Other | Admitting: Gastroenterology

## 2021-09-08 VITALS — BP 138/81 | HR 80 | Temp 97.7°F | Ht 67.0 in | Wt 241.8 lb

## 2021-09-08 DIAGNOSIS — K5000 Crohn's disease of small intestine without complications: Secondary | ICD-10-CM

## 2021-09-08 MED ORDER — DICYCLOMINE HCL 10 MG PO CAPS: 10.0000 mg | ORAL_CAPSULE | Freq: Two times a day (BID) | ORAL | 1 refills | Status: AC | PRN

## 2021-09-08 NOTE — Progress Notes (Signed)
Brett Trevino, M.D. Gastroenterology & Hepatology [facility] [address] [place], [place] [address]  Primary Care Physician: Curlene Labrum, MD [place] [id number]  I will communicate my assessment and recommendations to the referring MD via EMR.  Problems: Crohn's ileitis in remission  History of Present Illness: Brett Trevino is a 66 y.o. male with PMh prostate cancer s/p surgery, DM, HTN HLD, GERD, and Crohn's disease of the small bowel, who presents for follow up of Crohn's ileitis.  The patient was last seen on 03/10/2021. At that time, the patient was continued on 6-MP 75 mg every day and had routine blood testing checked.  Patient reports that since the last time he saw me in clinic, he presented two recurrent episodes of abdominal pain described as severe cramps in his abdomen (first in March, second recently). During his last episode (2 weeks ago) he had significant nausea and vomiting. He denied having any melena or hematochezia but believes he had diarrhea for a few days. He reports that the symptoms resolved on their own after a couple of days.  He is concerned as the last episode was very severe.  He takes Imodium as needed, which is very rare. Currently he has 2-4 Bms but they are soft - [place] 6.  The patient denies having any nausea, vomiting, fever, chills, hematochezia, melena, hematemesis, jaundice, pruritus or weight loss.  Most recent blood work-up performed on 08/26/2021 showed an elevated CRP of 20.7, CBC with mild decrease lymphocytes 612, CMP without major abnormalities and elevated fecal calprotectin of 298.  Last flu shot: 2022 Last pneumonia shot: 3 Years ago possibly Last evaluation by dermatology: saw them in 2022 Last zoster vaccine: 2023 COVID-19 shot: Moderna x2   Last Colonoscopy: 04/21/2020 that showed normal terminal ileum up to 10 cm from the ileocecal valve, diverticulosis and  internal hemorrhoids.  Past Medical History: Past Medical History:  Diagnosis Date   Complication of anesthesia    difficulty waking up after anesthesia   Crohn disease ([place]) 08/28/2017   Diabetes ([place])    type 2   GERD (gastroesophageal reflux disease)    H/O vitamin D deficiency    High cholesterol    History of kidney stones    History of kidney stones    Prostate cancer [facility])    Prostate    Past Surgical History: Past Surgical History:  Procedure Laterality Date   BIOPSY  12/23/2017   Procedure: BIOPSY;  Surgeon: Gatha Mayer, MD;  Location: [facility];  Service: Endoscopy;;   COLONOSCOPY  2013   COLONOSCOPY WITH PROPOFOL N/A 12/23/2017   Procedure: COLONOSCOPY WITH PROPOFOL;  Surgeon: Gatha Mayer, MD;  Location: [facility];  Service: Endoscopy;  Laterality: N/A;   COLONOSCOPY WITH PROPOFOL N/A 04/21/2020   Procedure: COLONOSCOPY WITH PROPOFOL;  Surgeon: Harvel Quale, MD;  Location: [facility];  Service: Gastroenterology;  Laterality: N/A;  9:30   CYSTOSCOPY W/ URETERAL STENT PLACEMENT Right 09/16/2018   Procedure: CYSTOSCOPY WITH RETROGRADE PYELOGRAM/URETERAL STENT PLACEMENT;  Surgeon: Ceasar Mons, MD;  Location: [facility];  Service: Urology;  Laterality: Right;   EXTRACORPOREAL SHOCK WAVE LITHOTRIPSY Right 10/03/2018   Procedure: EXTRACORPOREAL SHOCK WAVE LITHOTRIPSY (ESWL);  Surgeon: Raynelle Bring, MD;  Location: [facility];  Service: Urology;  Laterality: Right;   LITHOTRIPSY     LYMPHADENECTOMY Bilateral 12/06/2017   Procedure: LYMPHADENECTOMY, PELVIC;  Surgeon: Raynelle Bring, MD;  Location: [facility];  Service: Urology;  Laterality: Bilateral;   PROSTATE BIOPSY N/A 09/28/2017   Procedure: BIOPSY TRANSRECTAL ULTRASONIC PROSTATE (TUBP);  Surgeon: Irine Seal, MD;  Location: [facility];  Service: Urology;  Laterality: N/A;   PROSTATECTOMY     ROBOT ASSISTED LAPAROSCOPIC RADICAL PROSTATECTOMY N/A 12/06/2017   Procedure: XI ROBOTIC ASSISTED LAPAROSCOPIC  RADICAL PROSTATECTOMY LEVEL 2;  Surgeon: Raynelle Bring, MD;  Location: [facility];  Service: Urology;  Laterality: N/A;   TOOTH EXTRACTION  1999    Family History: Family History  Problem Relation Age of Onset   Healthy Sister    Heart disease Mother    Alzheimer's disease Mother    Other Father        MVA   Heart disease Brother    Heart attack Brother        age 86   Diabetes Maternal Uncle    Diabetes Paternal Aunt    Healthy Brother    Colon cancer Maternal Aunt    Diabetes Mellitus II Paternal Uncle     Social History: Social History   Tobacco Use  Smoking Status Never  Smokeless Tobacco Never   Social History   Substance and Sexual Activity  Alcohol Use Never   Social History   Substance and Sexual Activity  Drug Use Never    Allergies: Allergies  Allergen Reactions   Morphine And Related Nausea Only    Medications: Current Outpatient Medications  Medication Sig Dispense Refill   aspirin EC 81 MG tablet Take 81 mg by mouth daily.     Calcium Citrate (CITRACAL PO) Take 1 tablet by mouth daily.     loperamide (IMODIUM) 2 MG capsule Take 1 capsule (2 mg total) by mouth daily as needed for diarrhea or loose stools.     mercaptopurine (PURINETHOL) 50 MG tablet Take 1.5 tablets (75 mg total) by mouth daily. 135 tablet 1   metFORMIN (GLUCOPHAGE) 500 MG tablet Take 500 mg by mouth daily with breakfast.     Multiple Vitamin (MULTI VITAMIN MENS PO) Take 1 tablet by mouth daily.     No current facility-administered medications for this visit.    Review of Systems: GENERAL: negative for malaise, night sweats HEENT: No changes in hearing or vision, no nose bleeds or other nasal problems. NECK: Negative for lumps, goiter, pain and significant neck swelling RESPIRATORY: Negative for cough, wheezing CARDIOVASCULAR: Negative for chest pain, leg swelling, palpitations, orthopnea GI: SEE HPI MUSCULOSKELETAL: Negative for joint pain or swelling, back pain, and muscle  pain. SKIN: Negative for lesions, rash PSYCH: Negative for sleep disturbance, mood disorder and recent psychosocial stressors. HEMATOLOGY Negative for prolonged bleeding, bruising easily, and swollen nodes. ENDOCRINE: Negative for cold or heat intolerance, polyuria, polydipsia and goiter. NEURO: negative for tremor, gait imbalance, syncope and seizures. The remainder of the review of systems is noncontributory.   Physical Exam: BP 138/81 (BP Location: Left Arm, Patient Position: Sitting, Cuff Size: Large)   Pulse 80   Temp 97.7 F (36.5 C) (Oral)   Ht '5\' 7"'$  (1.702 m)   Wt 241 lb 12.8 oz (109.7 kg)   BMI 37.87 kg/m  GENERAL: The patient is AO x3, in no acute distress. HEENT: Head is normocephalic and atraumatic. EOMI are intact. Mouth is well hydrated and without lesions. NECK: Supple. No masses LUNGS: Clear to auscultation. No presence of rhonchi/wheezing/rales. Adequate chest expansion HEART: RRR, normal s1 and s2. ABDOMEN: Soft, nontender, no guarding, no peritoneal signs, and nondistended. BS +. Ventral hernia EXTREMITIES: Without any cyanosis, clubbing, rash, lesions  or edema. NEUROLOGIC: AOx3, no focal motor deficit. SKIN: no jaundice, no rashes  Imaging/Labs: as above  I personally reviewed and interpreted the available labs, imaging and endoscopic files.  Impression and Plan: Brett Trevino is a 66 y.o. male with PMh prostate cancer s/p surgery, DM, HTN HLD, GERD, and Crohn's disease of the small bowel, who presents for follow up of Crohn's ileitis.  The patient has had a history of Crohn's ileitis that has been managed in the past with 75 mg of 6-MP on a daily basis.  He was not able to tolerate higher doses as he presented elevation of his LFTs when this was performed.  He had a relatively recent colonoscopy that did not show any active Crohn's disease in his distal colon but the indirect markers such as CRP and fecal calprotectin have shown significant elevation which  raises a concern of ongoing inflammation at the mucosal level at other locations of his small bowel.  We had a thorough discussion regarding the next steps in his evaluation and we decided to proceed with a capsule endoscopy to evaluate further the lining of his small bowel.  If there is presence of ongoing inflammation we will need to discuss switching him to another agent, ideally a biologic agent such as Stelara, Skyrizi or [employer].  Due to this, we will check QuantiFERON and hepatitis B serologies.  I will prescribe Bentyl as needed for management of his episodes of abdominal pain.  He will continue taking mercaptopurine at the same dosage.  - Continue mercaptopurine 75 mg qday - Check QuantiFERON and hepatitis B serologies - Schedule capsule endoscopy - Start Bentyl 1 tablet q12h as needed for abdominal pain   All questions were answered.      Harvel Quale, MD Gastroenterology and Hepatology [facility]

## 2021-09-08 NOTE — Patient Instructions (Addendum)
Continue mercaptopurine 75 mg qday Perform blood workup Schedule capsule endoscopy Start Bentyl 1 tablet q12h as needed for abdominal pain

## 2021-09-12 ENCOUNTER — Telehealth (INDEPENDENT_AMBULATORY_CARE_PROVIDER_SITE_OTHER): Payer: Self-pay | Admitting: *Deleted

## 2021-09-12 NOTE — Telephone Encounter (Signed)
Received voicemail from [place] from [place] [employer] that dicyclomine '10mg'$  capsule was approved from 09/09/21 -09/10/22. She states member was notified and I called [facility] in [place] and left it on the prescriber voicemail letting pharmacy know med was approved and to call us back if any issues.

## 2021-09-13 ENCOUNTER — Telehealth (INDEPENDENT_AMBULATORY_CARE_PROVIDER_SITE_OTHER): Payer: Self-pay

## 2021-09-13 LAB — QUANTIFERON-TB GOLD PLUS
Mitogen-NIL: 7.31 IU/mL
NIL: 0.02 IU/mL
QuantiFERON-TB Gold Plus: NEGATIVE
TB1-NIL: <0 IU/mL
TB2-NIL: <0 IU/mL

## 2021-09-13 LAB — HEPATITIS B SURFACE ANTIGEN: Hepatitis B Surface Ag: NONREACTIVE

## 2021-09-13 NOTE — Telephone Encounter (Signed)
Ok thanks, please let him know he will need to call us to schedule it once he is ready to do so

## 2021-09-13 NOTE — Telephone Encounter (Signed)
I called Brett Trevino to schedule his Capsule Endoscopy and he stated he is in the process of moving and will not be able to get this done before leaving

## 2021-09-29 DIAGNOSIS — E1169 Type 2 diabetes mellitus with other specified complication: Secondary | ICD-10-CM | POA: Diagnosis not present

## 2021-09-29 DIAGNOSIS — K50919 Crohn's disease, unspecified, with unspecified complications: Secondary | ICD-10-CM | POA: Diagnosis not present

## 2021-09-29 DIAGNOSIS — E785 Hyperlipidemia, unspecified: Secondary | ICD-10-CM | POA: Diagnosis not present

## 2021-09-29 DIAGNOSIS — R252 Cramp and spasm: Secondary | ICD-10-CM | POA: Diagnosis not present

## 2021-12-13 ENCOUNTER — Other Ambulatory Visit (INDEPENDENT_AMBULATORY_CARE_PROVIDER_SITE_OTHER): Payer: Self-pay

## 2021-12-13 DIAGNOSIS — K5000 Crohn's disease of small intestine without complications: Secondary | ICD-10-CM

## 2021-12-13 MED ORDER — MERCAPTOPURINE 50 MG PO TABS: 75.0000 mg | ORAL_TABLET | Freq: Every day | ORAL | 1 refills | Status: DC

## 2021-12-20 ENCOUNTER — Telehealth (INDEPENDENT_AMBULATORY_CARE_PROVIDER_SITE_OTHER): Payer: Self-pay | Admitting: *Deleted

## 2021-12-20 ENCOUNTER — Other Ambulatory Visit (INDEPENDENT_AMBULATORY_CARE_PROVIDER_SITE_OTHER): Payer: Self-pay

## 2021-12-20 DIAGNOSIS — K5000 Crohn's disease of small intestine without complications: Secondary | ICD-10-CM

## 2021-12-20 MED ORDER — MERCAPTOPURINE 50 MG PO TABS: 75.0000 mg | ORAL_TABLET | Freq: Every day | ORAL | 1 refills | Status: AC

## 2021-12-22 NOTE — Telephone Encounter (Signed)
error 

## 2022-01-26 DIAGNOSIS — H25813 Combined forms of age-related cataract, bilateral: Secondary | ICD-10-CM | POA: Diagnosis not present

## 2022-01-26 DIAGNOSIS — H5203 Hypermetropia, bilateral: Secondary | ICD-10-CM | POA: Diagnosis not present

## 2022-01-26 DIAGNOSIS — E119 Type 2 diabetes mellitus without complications: Secondary | ICD-10-CM | POA: Diagnosis not present

## 2022-01-30 DIAGNOSIS — E785 Hyperlipidemia, unspecified: Secondary | ICD-10-CM | POA: Diagnosis not present

## 2022-01-30 DIAGNOSIS — Z23 Encounter for immunization: Secondary | ICD-10-CM | POA: Diagnosis not present

## 2022-01-30 DIAGNOSIS — Z9079 Acquired absence of other genital organ(s): Secondary | ICD-10-CM | POA: Diagnosis not present

## 2022-01-30 DIAGNOSIS — E1169 Type 2 diabetes mellitus with other specified complication: Secondary | ICD-10-CM | POA: Diagnosis not present

## 2022-03-06 DIAGNOSIS — K5000 Crohn's disease of small intestine without complications: Secondary | ICD-10-CM | POA: Diagnosis not present

## 2022-03-06 DIAGNOSIS — Z79899 Other long term (current) drug therapy: Secondary | ICD-10-CM | POA: Diagnosis not present

## 2022-03-20 ENCOUNTER — Ambulatory Visit (INDEPENDENT_AMBULATORY_CARE_PROVIDER_SITE_OTHER): Payer: Medicare Other | Admitting: Gastroenterology

## 2022-12-22 ENCOUNTER — Other Ambulatory Visit (INDEPENDENT_AMBULATORY_CARE_PROVIDER_SITE_OTHER): Payer: Self-pay | Admitting: Gastroenterology

## 2022-12-22 DIAGNOSIS — K5000 Crohn's disease of small intestine without complications: Secondary | ICD-10-CM
